# Patient Record
Sex: Female | Born: 1990 | Hispanic: Yes | Marital: Single | Smoking: Never smoker
Health system: Southern US, Community
[De-identification: ages and names within clinical notes are randomized; demographics above are authoritative.]

## PROBLEM LIST (undated history)

## (undated) DIAGNOSIS — K219 Gastro-esophageal reflux disease without esophagitis: Secondary | ICD-10-CM

---

## 2015-03-17 ENCOUNTER — Encounter: Payer: Self-pay | Admitting: Obstetrics and Gynecology

## 2015-03-22 ENCOUNTER — Encounter: Payer: Self-pay | Admitting: Advanced Practice Midwife

## 2015-04-06 ENCOUNTER — Encounter: Payer: Self-pay | Admitting: Obstetrics & Gynecology

## 2015-04-06 ENCOUNTER — Ambulatory Visit (INDEPENDENT_AMBULATORY_CARE_PROVIDER_SITE_OTHER): Payer: Self-pay | Admitting: Obstetrics & Gynecology

## 2015-04-06 VITALS — BP 127/80 | HR 88 | Ht 63.0 in | Wt 182.0 lb

## 2015-04-06 DIAGNOSIS — Z124 Encounter for screening for malignant neoplasm of cervix: Secondary | ICD-10-CM

## 2015-04-06 DIAGNOSIS — Z113 Encounter for screening for infections with a predominantly sexual mode of transmission: Secondary | ICD-10-CM

## 2015-04-06 DIAGNOSIS — Z3401 Encounter for supervision of normal first pregnancy, first trimester: Secondary | ICD-10-CM

## 2015-04-06 DIAGNOSIS — O0990 Supervision of high risk pregnancy, unspecified, unspecified trimester: Secondary | ICD-10-CM | POA: Insufficient documentation

## 2015-04-06 NOTE — Progress Notes (Signed)
Bedside ultrasound shows 7264w3d fetus with heartbeat.

## 2015-04-06 NOTE — Progress Notes (Signed)
      Subjective:    Susan Owens is a 24 y.o. G1P0 at 8365w3d, by LMP consistent with today's scan, being seen today for her first obstetrical visit.  Patient does intend to breast feed.  Patient reports no complaints.  Filed Vitals:   04/06/15 0950 04/06/15 0954  BP: 127/80   Pulse: 88   Height:  5\' 3"  (1.6 m)  Weight: 182 lb (82.555 kg)     HISTORY: OB History  Gravida Para Term Preterm AB SAB TAB Ectopic Multiple Living  1             # Outcome Date GA Lbr Len/2nd Weight Sex Delivery Anes PTL Lv  1 Current              History reviewed. No pertinent past medical history. History reviewed. No pertinent past surgical history. History reviewed. No pertinent family history.   Exam    Uterus:     Pelvic Exam:    Perineum: No Hemorrhoids, Normal Perineum   Vulva: normal   Vagina:  normal mucosa, normal discharge   Cervix: no bleeding following Pap, no cervical motion tenderness, no lesions and nulliparous appearance   Adnexa: normal adnexa and no mass, fullness, tenderness   Bony Pelvis: average  System: Breast:  normal appearance, no masses or tenderness   Skin: normal coloration and turgor, no rashes    Neurologic: oriented, normal   Extremities: normal strength, tone, and muscle mass, ROM of all joints is normal   HEENT PERRLA, extra ocular movement intact and sclera clear, anicteric   Mouth/Teeth mucous membranes moist, pharynx normal without lesions and dental hygiene good   Neck supple and no masses   Cardiovascular: regular rate and rhythm   Respiratory:  appears well, vitals normal, no respiratory distress, acyanotic, normal RR, chest clear, no wheezing, crepitations, rhonchi, normal symmetric air entry   Abdomen: soft, non-tender; bowel sounds normal; no masses,  no organomegaly   Urinary: urethral meatus normal      Assessment:    Pregnancy: G1P0 Patient Active Problem List   Diagnosis Date Noted  . Encounter for supervision of normal  first pregnancy 04/06/2015        Plan:     Initial labs drawn. Prenatal vitamins. Problem list reviewed and updated. Genetic Screening discussed First Screen: ordered.  Ultrasound discussed; fetal survey: ordered. [facility] with multiple MDs and other Advanced Practice Providers was explained to patient; also emphasized that residents, students are part of our team.  Follow up in 4 weeks.  Routine obstetric precautions reviewed.   Tereso NewcomerANYANWU,Pankaj Haack A, MD

## 2015-04-06 NOTE — Patient Instructions (Addendum)
Primer trimestre de Psychiatristembarazo (First Trimester of Pregnancy) El primer trimestre de Psychiatristembarazo se extiende desde la semana1 hasta el final de la semana12 (mes1 al mes3). Una semana despus de que un espermatozoide fecunda un vulo, este se implantar en la pared uterina. Este embrin comenzar a Camera operatordesarrollarse hasta convertirse en un beb. Sus genes y los de su pareja forman el beb. Los genes del varn determinan si ser un nio o una nia. Entre la semana6 y [place]8, se forman los ojos y Poole[place] rostro, y los latidos del corazn pueden verse en la ecografa. Al final de las 12semanas, todos los rganos del beb estn formados.  Ahora que est embarazada, querr hacer todo lo que est a su alcance para tener un beb sano. Dos de las cosas ms importantes son Winferd Humphreytener una buena atencin prenatal y seguir las indicaciones del mdico. La atencin prenatal incluye toda la asistencia mdica que usted recibe antes del nacimiento del beb. Esta ayudar a prevenir, Engineer, manufacturingdetectar y tratar cualquier problema durante el embarazo y [place] parto. CAMBIOS EN EL ORGANISMO Su organismo atraviesa por muchos cambios durante el [place]bar[place], y estos varan de Neomia Dearuna mujer a Educational psychologistotra.   Al principio, puede aumentar o bajar algunos kilos.  Puede tener Programme researcher, broadcasting/film/videomalestar estomacal (nuseas) y vomitar. Si no puede controlar los vmitos, llame al mdico.  Puede cansarse con facilidad.  Es posible que tenga dolores de cabeza que pueden aliviarse con los medicamentos que el mdico le permita tomar.  Puede orinar con mayor frecuencia. El dolor al orinar puede significar que usted tiene una infeccin de la vejiga.  Debido al Vanetta Muldersembarazo, puede tener acidez estomacal.  Puede estar estreida, ya que ciertas hormonas enlentecen los movimientos de los msculos que New York Life Insuranceempujan los desechos a travs de los intestinos.  Pueden aparecer hemorroides o abultarse e hincharse las venas (venas varicosas).  Las ConAgra Foodsmamas pueden empezar a Government social research officeragrandarse y Emergency planning/management officerestar sensibles. Los pezones  pueden sobresalir ms, y el tejido que los rodea (areola) tornarse ms oscuro.  Las Veterinary surgeonencas pueden sangrar y estar sensibles al cepillado y al hilo dental.  Pueden aparecer zonas oscuras o manchas (cloasma, mscara del Psychiatristembarazo) en el rostro que probablemente se atenuarn despus del nacimiento del beb.  Los perodos menstruales se interrumpirn.  Tal vez no tenga apetito.  Puede sentir un fuerte deseo de consumir ciertos alimentos.  Puede tener cambios a Theatre managernivel emocional da a da, por ejemplo, por momentos puede estar emocionada por el Psychiatristembarazo y por otros preocuparse porque algo pueda salir mal con el embarazo o el beb.  Tendr sueos ms vvidos y extraos.  Tal vez haya cambios en el cabello que pueden incluir su engrosamiento, crecimiento rpido y cambios en la textura. A algunas mujeres tambin se les cae el cabello durante o despus del Wells Riverembarazo, o tienen el cabello seco o fino. Lo ms probable es que el cabello se le normalice despus del nacimiento del beb. QU DEBE ESPERAR EN LAS CONSULTAS PRENATALES Durante una visita prenatal de rutina:  La pesarn para asegurarse de que usted y el beb estn creciendo normalmente.  Le controlarn la presin arterial.  Le medirn el abdomen para controlar el desarrollo del beb.  Se escucharn los latidos cardacos a partir de la semana10 o la12 de embarazo, aproximadamente.  Se analizarn los resultados de los estudios solicitados en visitas anteriores. El mdico puede preguntarle:  Cmo se siente.  Si siente los movimientos del beb.  Si ha tenido FedExsntomas anormales, como prdida de lquido, Urichsangrado, dolores de cabeza intensos  o clicos abdominales.  Si tiene Colgate-Palmolive. Otros estudios que pueden realizarse durante el primer trimestre incluyen lo siguiente:  Anlisis de sangre para determinar el tipo de sangre y Engineer, manufacturing la presencia de infecciones previas. Adems, se los usar para controlar si los niveles de hierro  son bajos (anemia) y Chief Strategy Officer los anticuerpos Rh. En una etapa ms avanzada del [place], se harn anlisis de sangre para saber si tiene diabetes, junto con otros estudios si surgen problemas.  Anlisis de orina para detectar infecciones, diabetes o protenas en la orina.  Una ecografa para confirmar que el beb crece y se desarrolla correctamente.  Una amniocentesis para diagnosticar posibles problemas genticos.  Estudios del feto para descartar espina bfida y sndrome de Down.  Es posible que necesite otras pruebas adicionales. INSTRUCCIONES PARA EL CUIDADO EN EL HOGAR  Medicamentos:  Siga las indicaciones del mdico en relacin con el uso de medicamentos. Durante el embarazo, hay medicamentos que pueden tomarse y otros que no.  Tome las vitaminas prenatales como se le indic.  Si est estreida, tome un laxante suave, si el mdico lo [place]. Dieta  Consuma alimentos balanceados. Elija alimentos variados, como carne o protenas de origen vegetal, pescado, leche y productos lcteos descremados, verduras, frutas y panes y Radiation protection practitioner. El mdico la ayudar a Production assistant, radio cantidad de peso que puede [place].  No coma carne cruda ni quesos sin cocinar. Estos elementos contienen bacterias que pueden causar defectos congnitos en el beb.  La ingesta diaria de cuatro o cinco comidas pequeas en lugar de tres comidas abundantes puede ayudar a Yahoo nuseas y los vmitos. Si empieza a tener nuseas, comer algunas [address] puede ser de [place]. Beber lquidos [place] comidas en lugar de tomarlos durante las comidas tambin puede ayudar a Optician, dispensing las nuseas y los vmitos.  Si est estreida, consuma alimentos con alto contenido de [place], como verduras y frutas frescas, y Radiation protection practitioner. Beba suficiente lquido para Photographer orina clara o de color amarillo plido. Actividad y Landscape architect ejercicio solamente como se lo haya indicado el mdico. El  ejercicio la ayudar a:  Art gallery manager.  Mantenerse en forma.  Estar preparada para el trabajo de parto y [place].  Los dolores, los clicos en la parte baja del abdomen o los calambres en la cintura son un buen indicio de que debe dejar de Corporate treasurer. Consulte al mdico antes de seguir haciendo ejercicios normales.  Intente no estar de pie FedEx. Mueva las piernas con frecuencia si debe estar de pie en un lugar durante mucho tiempo.  Evite levantar pesos [employer] Brands.  Use zapatos de tacones bajos y [place].  Puede seguir teniendo [employer] [place], excepto que el mdico le indique lo contrario. Alivio del dolor o las molestias  Use un sostn que le brinde buen soporte si siente dolor a la palpacin Mattel.  Dese baos de asiento con agua tibia para Engineer, materials o las molestias causadas por las hemorroides. Use crema antihemorroidal si el mdico se lo permite.  Descanse con las piernas elevadas si tiene calambres o dolor de cintura.  Si tiene venas varicosas en las piernas, use medias de descanso. Eleve los pies durante , 3 o 4veces por da. Limite la cantidad de sal en su dieta. Cuidados prenatales  Programe las visitas prenatales para la semana12 de [place]. Generalmente se programan cada mes al principio y se hacen ms frecuentes en los 2 ltimos meses  antes del parto.  Escriba sus preguntas. Llvelas cuando concurra a las visitas prenatales.  Concurra a todas las visitas prenatales como se lo haya indicado el mdico. Seguridad  Colquese el cinturn de seguridad cuando conduzca.  Haga una lista de los nmeros de telfono de Associate Professor, que [employer] nmeros de telfono de familiares, [place], el hospital y los departamentos de polica y bomberos. Consejos generales  Pdale al mdico que la derive a clases de educacin prenatal en su localidad. Debe comenzar a tomar las clases antes de Cytogeneticist en el mes6  de embarazo.  Pida ayuda si tiene necesidades nutricionales o de asesoramiento Academic librarian. El mdico puede aconsejarla o derivarla a especialistas para que la ayuden con diferentes necesidades.  No se d baos de inmersin en agua caliente, baos turcos ni saunas.  No se haga duchas vaginales ni use tampones o toallas higinicas perfumadas.  No mantenga las piernas cruzadas durante [place].  Evite el contacto con las bandejas sanitarias de los gatos y la tierra que estos animales usan. Estos elementos contienen bacterias que pueden causar defectos congnitos al beb y la posible prdida del feto debido a un aborto espontneo o muerte fetal.  No fume, no consuma hierbas ni medicamentos que no hayan sido recetados por el mdico. Las sustancias qumicas que estos productos contienen afectan la formacin y el desarrollo del beb.  Programe una cita con el dentista. En su casa, lvese los dientes con un cepillo dental blando y psese el hilo dental con suavidad. SOLICITE ATENCIN MDICA SI:   Tiene mareos.  Siente clicos leves, presin en la pelvis o dolor persistente en el abdomen.  Tiene nuseas, vmitos o diarrea persistentes.  Tiene secrecin vaginal con mal olor.  Siente dolor al ConocoPhillips.  Tiene el rostro, las [place], las piernas o los tobillos ms hinchados. SOLICITE ATENCIN MDICA DE INMEDIATO SI:   Tiene fiebre.  Tiene una prdida de lquido por la vagina.  Tiene sangrado o pequeas prdidas vaginales.  Siente dolor intenso o clicos en el abdomen.  Sube o baja de peso rpidamente.  Vomita sangre de color rojo brillante o material que parezca granos de caf.  Ha estado expuesta a la rubola y no ha sufrido la enfermedad.  Ha estado expuesta a la quinta enfermedad o a la varicela.  Tiene un dolor de cabeza intenso.  Le falta el aire.  Sufre cualquier tipo de traumatismo, por ejemplo, debido a una cada o un accidente automovilstico. Document  Released: 06/26/2005 Document Revised: 01/31/2014 [facility] Patient Information 2015 [place], [place]. This information is not intended to replace advice given to you by your health care provider. Make sure you discuss any questions you have with your health care provider.  Segundo trimestre de Psychiatrist (Second Trimester of Pregnancy) El segundo trimestre va desde la semana13 hasta la 28, desde el cuarto hasta el sexto mes, y suele ser el momento en el que mejor se siente. Su organismo se ha adaptado a Charity fundraiser y comienza a Diplomatic Services operational officer. En general, las nuseas matutinas han disminuido o han desaparecido completamente, p El segundo trimestre es tambin la poca en la que el feto se desarrolla rpidamente. Hacia el final del sexto mes, el feto mide aproximadamente 9pulgadas (23cm) y pesa alrededor de 1 libras (700g). Es probable que sienta que el beb se Teacher, English as a foreign language (da pataditas) entre las 18 y 20semanas del Psychiatrist. CAMBIOS EN EL ORGANISMO Su organismo atraviesa por muchos cambios durante el [place], y estos varan de [place]  a otra.   Seguir American Standard Companies. Notar que la parte baja del abdomen sobresale.  Podrn aparecer las primeras Albertson's caderas, el abdomen y las [place].  Es posible que tenga dolores de cabeza que pueden aliviarse con los medicamentos que su mdico autorice.  Tal vez tenga necesidad de orinar con ms frecuencia porque el feto est ejerciendo presin Ambulance person.  Debido al Vanetta Mulders podr sentir Anthoney Harada estomacal con frecuencia.  Puede estar estreida, ya que ciertas hormonas enlentecen los movimientos de los msculos que New York Life Insurance desechos a travs de los intestinos.  Pueden aparecer hemorroides o abultarse e hincharse las venas (venas varicosas).  Puede tener dolor de espalda que se debe al Citigroup de peso y a que las hormonas del Management consultant las articulaciones entre los huesos de la pelvis, y Public librarian consecuencia de la  modificacin del peso y los msculos que mantienen el equilibrio.  Las ConAgra Foods seguirn creciendo y Development worker, community.  Las Veterinary surgeon y estar sensibles al cepillado y al hilo dental.  Pueden aparecer zonas oscuras o manchas (cloasma, mscara del Psychiatrist) en el rostro que probablemente se atenuarn despus del nacimiento del beb.  Es posible que se forme una lnea oscura desde el ombligo hasta la zona del pubis (linea nigra) que probablemente se atenuarn despus del nacimiento del beb.  Tal vez haya cambios en el cabello que pueden incluir su engrosamiento, crecimiento rpido y cambios en la textura. Adems, a algunas mujeres se les cae el cabello durante o despus del embarazo, o tienen el cabello seco o fino. Lo ms probable es que el cabello se le normalice despus del nacimiento del beb. QU DEBE ESPERAR EN LAS CONSULTAS PRENATALES Durante una visita prenatal de rutina:  La pesarn para asegurarse de que usted y el feto estn creciendo normalmente.  Le tomarn la presin arterial.  Le medirn el abdomen para controlar el desarrollo del beb.  Se escucharn los latidos cardacos fetales.  Se evaluarn los resultados de los estudios solicitados en visitas anteriores. El mdico puede preguntarle lo siguiente:  Cmo se siente.  Si siente los movimientos del beb.  Si ha tenido sntomas anormales, como prdida de lquido, [place], dolores de cabeza intensos o clicos abdominales.  Si tiene Colgate-Palmolive. Otros estudios que podrn realizarse durante el segundo trimestre incluyen lo siguiente:  Anlisis de sangre para detectar:  Concentraciones de hierro bajas (anemia).  Diabetes gestacional (entre la semana 24 y la 28).  Anticuerpos Rh.  Anlisis de orina para detectar infecciones, diabetes o protenas en la orina.  Una ecografa para confirmar que el beb crece y se desarrolla correctamente.  Una amniocentesis para diagnosticar posibles problemas  genticos.  Estudios del feto para descartar espina bfida y sndrome de [place]. INSTRUCCIONES PARA EL CUIDADO EN EL HOGAR   Evite fumar, consumir hierbas, beber alcohol y tomar frmacos que no le hayan recetado. Estas sustancias qumicas afectan la formacin y el desarrollo del beb.  Siga las indicaciones del mdico en relacin con el uso de medicamentos. Durante el embarazo, hay medicamentos que son seguros de tomar y otros que no.  Haga actividad fsica solo en la forma indicada por el mdico. Sentir clicos uterinos es un buen signo para Restaurant manager, fast food actividad fsica.  Contine comiendo alimentos que sanos con regularidad.  Use un sostn que le brinde buen soporte si le Altria [employer].  No se d baos de inmersin en agua caliente, baos turcos ni saunas.  Colquese el cinturn de seguridad  cuando conduzca.  No coma carne cruda ni queso sin cocinar; evite el contacto con las bandejas sanitarias de los gatos y la tierra que estos animales usan. Estos elementos contienen grmenes que pueden causar defectos congnitos en el beb.  Tome las vitaminas prenatales.  Si est estreida, pruebe un laxante suave (si el mdico lo autoriza). Consuma ms alimentos ricos en fibra, como vegetales y frutas frescos y Radiation protection practitioner. Beba gran cantidad de lquido para mantener la orina de tono claro o color amarillo plido.  Dese baos de asiento con agua tibia para Engineer, materials o las molestias causadas por las hemorroides. Use una crema para las hemorroides si el mdico la autoriza.  Si tiene venas varicosas, use medias de descanso. Eleve los pies durante , 3 o 4veces por da. Limite la cantidad de sal en su dieta.  No levante objetos pesados, use zapatos de tacones bajos y [address].  Descanse con las piernas elevadas si tiene calambres o dolor de cintura.  Visite a su dentista si an no lo ha Occupational hygienist. Use un cepillo de dientes blando para  higienizarse los dientes y psese el hilo dental con suavidad.  Puede seguir [employer], a menos que el mdico le indique lo contrario.  Concurra a todas las visitas prenatales segn las indicaciones de su mdico. SOLICITE ATENCIN MDICA SI:   [place].  Siente clicos leves, presin en la pelvis o dolor persistente en el abdomen.  Tiene nuseas, vmitos o diarrea persistentes.  Tiene secrecin vaginal con mal olor.  Siente dolor al ConocoPhillips. SOLICITE ATENCIN MDICA DE INMEDIATO SI:   Tiene fiebre.  Tiene una prdida de lquido por la vagina.  Tiene sangrado o pequeas prdidas vaginales.  Siente dolor intenso o clicos en el abdomen.  Sube o baja de peso rpidamente.  Tiene dificultad para respirar y siente dolor de pecho.  Sbitamente se le hinchan mucho el rostro, las [place], los tobillos, los pies o las piernas.  No ha sentido los movimientos del beb durante Georgianne Fick.  Siente un dolor de cabeza intenso que no se alivia con medicamentos.  Hay cambios en la visin. Document Released: 06/26/2005 Document Revised: 09/21/2013 [facility] Patient Information 2015 [place], [place]. This information is not intended to replace advice given to you by your health care provider. Make sure you discuss any questions you have with your health care provider.  Lactancia materna (Breastfeeding) Decidir Museum/gallery exhibitions officer es una de las mejores elecciones que puede hacer por usted y su beb. El cambio hormonal durante el Psychiatrist produce el desarrollo del tejido mamario y [place] la cantidad y el tamao de los conductos galactforos. Estas hormonas tambin permiten que las protenas, los azcares y las grasas de la sangre produzcan la [employer] materna en las glndulas productoras de [place]. Las hormonas impiden que la leche materna sea liberada antes del nacimiento del beb, adems de impulsar el flujo de leche luego del nacimiento. Una vez que ha comenzado a Museum/gallery exhibitions officer, Conservation officer, nature  beb, as Immunologist succin o Theatre manager, pueden estimular la liberacin de [place] de las glndulas productoras de [place].  LOS BENEFICIOS DE AMAMANTAR Para el beb  La primera leche (calostro) ayuda a Careers information officer funcionamiento del sistema digestivo del beb.  La leche tiene anticuerpos que ayudan a Radio producer las infecciones en el beb.  El beb tiene una menor incidencia de asma, alergias y del sndrome de muerte sbita del lactante.  Los nutrientes en la Alexis Goodell materna son  mejores para el beb que la [place] y estn preparados exclusivamente para cubrir las necesidades del beb.  La leche materna mejora el desarrollo cerebral del beb.  Es menos probable que el beb desarrolle otras enfermedades, como obesidad infantil, asma o diabetes mellitus de tipo 2. Para usted   La lactancia materna favorece el desarrollo de un vnculo muy especial entre la madre y el beb.  Es conveniente. La leche materna siempre est disponible a la Human resources officer y es [place].  La lactancia materna ayuda a quemar caloras y a perder el peso ganado durante el [place].  Favorece la contraccin del tero al tamao que tena antes del embarazo de manera ms rpida y disminuye el sangrado (loquios) despus del parto.  La lactancia materna contribuye a reducir Nurse, adult de desarrollar diabetes mellitus de tipo 2, osteoporosis o cncer de mama o de ovario en el futuro. SIGNOS DE QUE EL BEB EST HAMBRIENTO Primeros signos de [address] de [place].  Se estira.  Mueve la cabeza de un lado a otro.  Mueve la cabeza y abre la boca cuando se le toca la mejilla o la comisura de la boca (reflejo de bsqueda).  Aumenta las vocalizaciones, tales como sonidos de succin, se relame los labios, emite arrullos, suspiros, o chirridos.  Mueve la [employer] boca.  Se chupa con ganas los dedos o las manos. Signos tardos de Fisher Scientific.  Llora de manera  intermitente. Signos de [employer] Corporation signos de hambre extrema requerirn que lo calme y lo consuele antes de que el beb pueda alimentarse adecuadamente. No espere a que se manifiesten los siguientes signos de hambre extrema para comenzar a Museum/gallery exhibitions officer:   Designer, jewellery.  Llanto intenso y fuerte.   Gritos. INFORMACIN BSICA SOBRE LA LACTANCIA MATERNA Iniciacin de la lactancia materna  Encuentre un lugar cmodo para sentarse o acostarse, con un buen respaldo para el cuello y la espalda.  Coloque una almohada o una manta enrollada debajo del beb para acomodarlo a la altura de la mama (si est sentada). Las almohadas para Museum/gallery exhibitions officer se han diseado especialmente a fin de servir de apoyo para los brazos y el beb [employer].  Asegrese de que el abdomen del beb est frente al suyo.  Masajee suavemente la mama. Con las yemas de los dedos, masajee la pared del pecho hacia el pezn en un movimiento circular. Esto estimula el flujo de [place]. Es posible que Engineer, manufacturing systems este movimiento mientras amamanta si la leche fluye lentamente.  Sostenga la mama con el pulgar por arriba del pezn y los otros 4 dedos por debajo de la mama. Asegrese de que los dedos se encuentren lejos del pezn y de la boca del beb.  Empuje suavemente los labios del beb con el pezn o con el dedo.  Cuando la boca del beb se abra lo suficiente, acrquelo rpidamente a la mama e introduzca todo el pezn y la zona oscura que lo rodea (areola), tanto como sea posible, dentro de la boca del beb.  Debe haber ms areola visible por arriba del labio superior del beb que por debajo del labio inferior.  La lengua del beb debe estar entre la enca inferior y la [place].  Asegrese de que la boca del beb est en la posicin correcta alrededor del pezn (prendida). Los labios del beb deben crear un sello sobre la mama y estar doblados hacia afuera (invertidos).  Es comn que el beb succione durante  2 a 3 minutos para  que comience el flujo de Colgate Palmolive. Cmo debe prenderse Es muy importante que le ensee al beb cmo prenderse adecuadamente a la mama. Si el beb no se prende adecuadamente, puede causarle dolor en el pezn y reducir la produccin de [place], y hacer que el beb tenga un escaso aumento de [place]. Adems, si el beb no se prende adecuadamente al pezn, puede tragar aire durante la alimentacin. Esto puede causarle molestias al beb. Hacer eructar al beb al [place] de mama puede ayudarlo a liberar el aire. Sin embargo, ensearle al beb cmo prenderse a la mama adecuadamente es la mejor manera de evitar que se sienta molesto por tragar Oceanographer se alimenta. Signos de que el beb se ha prendido adecuadamente al pezn:   Payton Doughty o succiona de modo silencioso, sin causarle dolor.  Se escucha que traga cada 3 o 4 succiones.   Hay movimientos musculares por arriba y por delante de sus odos al Printmaker. Signos de que el beb no se ha prendido Audiological scientist al pezn:   Hace ruidos de succin o de chasquido mientras se alimenta.  Siente dolor en el pezn. Si cree que el beb no se prendi correctamente, deslice el dedo en la comisura de la boca y [employer] Corporation las encas del beb para interrumpir la succin. Intente comenzar a amamantar nuevamente. Signos de Fish farm manager Signos del beb:   Disminuye gradualmente el nmero de succiones o cesa la succin por completo.  Se duerme.  Relaja el cuerpo.  Retiene una pequea cantidad de [facility] boca.  Se desprende solo del pecho. Signos que presenta usted:  Las mamas han aumentado la firmeza, el peso y el tamao 1 a 3 horas despus de Museum/gallery exhibitions officer.  Estn ms blandas inmediatamente despus de amamantar.  Un aumento del volumen de [place], y tambin un cambio en su consistencia y color se producen hacia el quinto da de Tour manager.  Los pezones no duelen, ni estn agrietados ni sangran. Signos de que su beb  recibe la cantidad de leche suficiente  Moja al menos 3 paales en 24 horas. La orina debe ser clara y de color amarillo plido a los [address]  [address] de [place].  Defeca al menos 3 veces en 24 horas a los 5 [address]  [address] de [address] La materia fecal debe ser blanda y [place].  Defeca al menos 3 veces en 24 horas a los [address] de [address] La materia fecal debe ser grumosa y [place].  No registra una prdida de peso mayor del 10% del peso al nacer durante los primeros 3 [address]  [address] de [place].  Aumenta de peso un promedio de 4 a 7onzas (113 a 198g) por semana despus de los 4 [address]  [address] de vida.  Aumenta de [place], [place], de Arab[place] uniforme a Glass blower/designer de los 5 [address]  [address] de vida, sin Passenger transport manager prdida de peso despus de las 2semanas de vida. Despus de alimentarse, es posible que el beb regurgite una pequea cantidad. Esto es frecuente. FRECUENCIA Y DURACIN DE LA LACTANCIA MATERNA El amamantamiento frecuente la ayudar a producir ms [place] y a Education officer, community de Engineer, mining en los pezones e hinchazn en las [place]. Alimente al beb cuando muestre signos de hambre o si siente la necesidad de reducir la congestin de las [place]. Esto se denomina "lactancia a demanda". Evite el uso del chupete mientras trabaja para establecer la lactancia (las primeras 4 a 6 semanas despus del nacimiento del beb). Despus de este perodo, podr ofrecerle un chupete.  Las investigaciones demostraron que el uso del chupete durante el primer ao de vida del beb disminuye el riesgo de desarrollar el sndrome de muerte sbita del lactante (SMSL). Permita que el nio se alimente en cada mama todo lo que desee. Contine amamantando al beb hasta que haya terminado de alimentarse. Cuando el beb se desprende o se queda dormido mientras se est alimentando de la primera mama, ofrzcale la segunda. Debido a que, con frecuencia, los recin [place] las primeras semanas de vida, es posible que deba despertar al beb para alimentarlo. Los horarios  de Acupuncturist de un beb a otro. Sin embargo, las siguientes reglas pueden servir como gua para ayudarla a Lawyer que el beb se alimenta adecuadamente:  Se puede amamantar a los recin nacidos (bebs de 4 semanas o menos de vida) cada 1 a 3 horas.  No deben transcurrir ms de 3 horas durante el da o 5 horas durante la noche sin que se amamante a los recin nacidos.  Debe amamantar al beb 8 veces como mnimo en un perodo de 24 horas, hasta que comience a introducir slidos en su dieta, a los 6 meses de vida aproximadamente. EXTRACCIN DE [employer] MATERNA La extraccin y Contractor de la leche materna le permiten asegurarse de que el beb se alimente exclusivamente de [place], aun en momentos en los que no puede amamantar. Esto tiene especial importancia si debe regresar al Aleen Campi en el perodo en que an est amamantando o si no puede estar presente en los momentos en que el beb debe alimentarse. Su asesor en lactancia puede orientarla sobre cunto tiempo es seguro almacenar [place].  El sacaleche es un aparato que le permite extraer leche de la mama a un recipiente estril. Luego, la leche materna extrada puede almacenarse en un refrigerador o Electrical engineer. Algunos sacaleches son Birdie Riddle, Delaney Meigs otros son elctricos. Consulte a su asesor en lactancia qu tipo ser ms conveniente para usted. Los sacaleches se pueden comprar; sin embargo, algunos hospitales y grupos de apoyo a la lactancia materna alquilan Sports coach. Un asesor en lactancia puede ensearle cmo extraer [employer], en caso de que prefiera no usar un sacaleche.  CMO CUIDAR LAS MAMAS DURANTE LA LACTANCIA MATERNA Los pezones se secan, agrietan y duelen durante la Tour manager. Las siguientes recomendaciones pueden ayudarla a Pharmacologist las [employer] y sanas:  Careers information officer usar jabn en los pezones.  Use un sostn de soporte. Aunque no son esenciales, las camisetas sin  mangas o los sostenes especiales para Museum/gallery exhibitions officer estn diseados para acceder fcilmente a las mamas, para Museum/gallery exhibitions officer sin tener que quitarse todo el sostn o la camiseta. Evite usar sostenes con aro o sostenes muy ajustados.  Seque al aire sus pezones durante 3 a despus de amamantar al beb.  Utilice solo apsitos de Haematologist sostn para Environmental health practitioner las prdidas de [place]. La prdida de un poco de [employer] tomas es normal.  Utilice lanolina sobre los pezones luego de Museum/gallery exhibitions officer. La lanolina ayuda a mantener la humedad normal de la piel. Si [place] lanolina pura, no tiene que lavarse los pezones antes de volver a Corporate treasurer al beb. La lanolina pura no es txica para el beb. Adems, puede extraer [employer] algunas gotas de [place] materna y Engineer, maintenance (IT) suavemente esa [employer], para que la [place] se seque al aire. Durante las primeras semanas despus de dar a luz, algunas mujeres pueden experimentar hinchazn en las mamas (congestin [place]). La congestin  puede hacer que sienta las mamas pesadas, calientes y sensibles al tacto. El pico de la congestin ocurre dentro de los 3 a 5 das despus del [place]. Las siguientes recomendaciones pueden ayudarla a Paramedic la congestin:  Vace por completo las mamas al [employer] o Environmental health practitioner. Puede aplicar calor hmedo en las mamas (en la ducha o con toallas hmedas para manos) antes de Museum/gallery exhibitions officer o extraer [employer]. Esto aumenta la circulacin y [place] a que la [place]. Si el beb no vaca por completo las [address] cuando lo [address], extraiga la [place] restante despus de que haya finalizado.  Use un sostn ajustado (para amamantar o comn) o una camiseta sin mangas durante 1 o 2 das para indicar al cuerpo que disminuya ligeramente la produccin de [place].  Aplique compresas de hielo [employer], a menos que le resulte demasiado incmodo.  Asegrese de que el beb est prendido y se encuentre en la posicin correcta mientras lo  alimenta. Si la congestin persiste luego de 48 horas o despus de seguir estas recomendaciones, comunquese con su mdico o un Holiday representative. RECOMENDACIONES GENERALES PARA EL CUIDADO DE LA SALUD DURANTE LA LACTANCIA MATERNA  Consuma alimentos saludables. Alterne comidas y colaciones, y coma 3 de cada una por da. Dado que lo que come [employer], es posible que algunas comidas hagan que su beb se vuelva ms irritable de lo habitual. Evite comer este tipo de alimentos si percibe que afectan de manera negativa al beb.  Beba leche, jugos de fruta y agua para Patent examiner su sed (aproximadamente 10 vasos al Futures trader).  Descanse con frecuencia, reljese y tome sus vitaminas prenatales para evitar la fatiga, el estrs y la anemia.  Contine con los autocontroles de la mama.  Evite masticar y fumar tabaco.  Evite el consumo de alcohol y drogas. Algunos medicamentos, que pueden ser perjudiciales para el beb, pueden pasar a travs de la [employer]. Es importante que consulte a su mdico antes de Medical sales representative, incluidos todos los medicamentos recetados y de [place], as como los suplementos vitamnicos y herbales. Puede quedar embarazada durante la lactancia. Si desea controlar la natalidad, consulte a su mdico cules son las opciones ms seguras para el beb. SOLICITE ATENCIN MDICA SI:   Usted siente que quiere dejar de Museum/gallery exhibitions officer o se siente frustrada con la lactancia.  Siente dolor en las mamas o en los pezones.  Sus pezones estn agrietados o Water quality scientist.  Sus pechos estn irritados, sensibles o calientes.  Tiene un rea hinchada en cualquiera de las mamas.  Siente escalofros o fiebre.  Tiene nuseas o vmitos.  Presenta una secrecin de otro lquido distinto de la leche materna de los pezones.  Sus mamas no se llenan antes de Museum/gallery exhibitions officer al beb para el quinto da despus del [place].  Se siente triste y deprimida.  El beb est demasiado somnoliento  como para comer bien.  El beb tiene problemas para dormir.  Moja menos de 3 paales en 24 horas.  Defeca menos de 3 veces en 24 horas.  La piel del beb o la parte blanca de los ojos se vuelven amarillentas.  El beb no ha aumentado de [place] a los [address] de [place]. SOLICITE ATENCIN MDICA DE INMEDIATO SI:   El beb est muy cansado Retail buyer) y no se quiere despertar para comer.  Le sube la fiebre sin causa. Document Released: 09/16/2005 Document Revised: 09/21/2013 [facility] Patient Information 2015 [place], [place]. This information is not intended to replace  advice given to you by your health care provider. Make sure you discuss any questions you have with your health care provider.  

## 2015-04-07 LAB — CYTOLOGY - PAP

## 2015-04-07 LAB — PRENATAL PROFILE (SOLSTAS)
ANTIBODY SCREEN: NEGATIVE
BASOS PCT: 0 % (ref 0–1)
Basophils Absolute: 0 10*3/uL (ref 0.0–0.1)
EOS ABS: 0 10*3/uL (ref 0.0–0.7)
Eosinophils Relative: 0 % (ref 0–5)
HCT: 38 % (ref 36.0–46.0)
HIV: NONREACTIVE
Hemoglobin: 12.7 g/dL (ref 12.0–15.0)
Hepatitis B Surface Ag: NEGATIVE
LYMPHS ABS: 1.5 10*3/uL (ref 0.7–4.0)
Lymphocytes Relative: 19 % (ref 12–46)
MCH: 30.2 pg (ref 26.0–34.0)
MCHC: 33.4 g/dL (ref 30.0–36.0)
MCV: 90.5 fL (ref 78.0–100.0)
MPV: 8.7 fL (ref 8.6–12.4)
Monocytes Absolute: 0.5 10*3/uL (ref 0.1–1.0)
Monocytes Relative: 6 % (ref 3–12)
NEUTROS ABS: 6.1 10*3/uL (ref 1.7–7.7)
NEUTROS PCT: 75 % (ref 43–77)
Platelets: 406 10*3/uL — ABNORMAL HIGH (ref 150–400)
RBC: 4.2 MIL/uL (ref 3.87–5.11)
RDW: 13.1 % (ref 11.5–15.5)
RH TYPE: POSITIVE
RUBELLA: 5.87 {index} — AB (ref <0.90)
WBC: 8.1 10*3/uL (ref 4.0–10.5)

## 2015-04-08 LAB — CULTURE, OB URINE
COLONY COUNT: NO GROWTH
Organism ID, Bacteria: NO GROWTH

## 2015-05-04 ENCOUNTER — Encounter: Payer: Self-pay | Admitting: Obstetrics & Gynecology

## 2015-05-04 ENCOUNTER — Ambulatory Visit (INDEPENDENT_AMBULATORY_CARE_PROVIDER_SITE_OTHER): Payer: Self-pay | Admitting: Obstetrics & Gynecology

## 2015-05-04 VITALS — BP 113/69 | HR 87 | Wt 185.0 lb

## 2015-05-04 DIAGNOSIS — E669 Obesity, unspecified: Secondary | ICD-10-CM

## 2015-05-04 DIAGNOSIS — O99210 Obesity complicating pregnancy, unspecified trimester: Secondary | ICD-10-CM | POA: Insufficient documentation

## 2015-05-04 DIAGNOSIS — Z36 Encounter for antenatal screening of mother: Secondary | ICD-10-CM

## 2015-05-04 DIAGNOSIS — O99212 Obesity complicating pregnancy, second trimester: Secondary | ICD-10-CM

## 2015-05-04 DIAGNOSIS — Z3402 Encounter for supervision of normal first pregnancy, second trimester: Secondary | ICD-10-CM

## 2015-05-04 NOTE — Progress Notes (Signed)
Subjective:  Susan Owens is a 24 y.o. G1P0 at [redacted]w[redacted]d being seen today for ongoing prenatal care.  Patient reports no complaints.   .   .  . Denies leaking of fluid.   The following portions of the patient's history were reviewed and updated as appropriate: allergies, current medications, past family history, past medical history, past social history, past surgical history and problem list.   Objective:   Filed Vitals:   05/04/15 0953  BP: 113/69  Pulse: 87  Weight: 185 lb (83.915 kg)    Fetal Status:           General:  Alert, oriented and cooperative. Patient is in no acute distress.  Skin: Skin is warm and dry. No rash noted.   Cardiovascular: Normal heart rate noted  Respiratory: Normal respiratory effort, no problems with respiration noted  Abdomen: Soft, gravid, appropriate for gestational age.       Vaginal:  .       Cervix: Not evaluated        Extremities: Normal range of motion.     Mental Status: Normal mood and affect. Normal behavior. Normal judgment and thought content.   Urinalysis:      Assessment and Plan:  Pregnancy: G1P0 at [redacted]w[redacted]d  1. Encounter for supervision of normal first pregnancy in second trimester  - US OB Detail + 14 Wk; Future - She declines all genetic testing. That is why she did not keep the First Screen appt with MFM.  2. Obesity affecting pregnancy in second trimester  - Glucose Tolerance, 1 HR (50g)  Preterm labor symptoms and general obstetric precautions including but not limited to vaginal bleeding, contractions, leaking of fluid and fetal movement were reviewed in detail with the patient. Please refer to After Visit Summary for other counseling recommendations.  Return in about 4 weeks (around 06/01/2015).   Allie Bossier, MD

## 2015-05-06 LAB — GLUCOSE TOLERANCE, 1 HOUR (50G) W/O FASTING: GLUCOSE 1 HOUR GTT: 77 mg/dL (ref 70–140)

## 2015-05-16 ENCOUNTER — Ambulatory Visit (HOSPITAL_COMMUNITY)
Admission: RE | Admit: 2015-05-16 | Payer: Self-pay | Source: Ambulatory Visit | Attending: Obstetrics & Gynecology | Admitting: Obstetrics & Gynecology

## 2015-05-22 ENCOUNTER — Other Ambulatory Visit: Payer: Self-pay | Admitting: Obstetrics & Gynecology

## 2015-05-22 ENCOUNTER — Ambulatory Visit (HOSPITAL_COMMUNITY)
Admission: RE | Admit: 2015-05-22 | Discharge: 2015-05-22 | Disposition: A | Discharge status: Home / Self Care | Payer: Self-pay | Source: Ambulatory Visit | Attending: Obstetrics & Gynecology | Admitting: Obstetrics & Gynecology

## 2015-05-22 DIAGNOSIS — Z3A19 19 weeks gestation of pregnancy: Secondary | ICD-10-CM | POA: Insufficient documentation

## 2015-05-22 DIAGNOSIS — O99212 Obesity complicating pregnancy, second trimester: Secondary | ICD-10-CM | POA: Insufficient documentation

## 2015-05-22 DIAGNOSIS — Z3689 Encounter for other specified antenatal screening: Secondary | ICD-10-CM

## 2015-05-22 DIAGNOSIS — Z36 Encounter for antenatal screening of mother: Secondary | ICD-10-CM | POA: Insufficient documentation

## 2015-05-22 DIAGNOSIS — O4402 Placenta previa specified as without hemorrhage, second trimester: Secondary | ICD-10-CM

## 2015-05-22 DIAGNOSIS — E669 Obesity, unspecified: Secondary | ICD-10-CM | POA: Insufficient documentation

## 2015-05-22 DIAGNOSIS — Z3401 Encounter for supervision of normal first pregnancy, first trimester: Secondary | ICD-10-CM

## 2015-05-29 ENCOUNTER — Other Ambulatory Visit: Payer: Self-pay | Admitting: Obstetrics & Gynecology

## 2015-05-29 DIAGNOSIS — O26872 Cervical shortening, second trimester: Secondary | ICD-10-CM

## 2015-05-29 DIAGNOSIS — Z3A23 23 weeks gestation of pregnancy: Secondary | ICD-10-CM

## 2015-05-29 DIAGNOSIS — O99212 Obesity complicating pregnancy, second trimester: Secondary | ICD-10-CM

## 2015-05-29 DIAGNOSIS — Z3689 Encounter for other specified antenatal screening: Secondary | ICD-10-CM

## 2015-06-04 ENCOUNTER — Other Ambulatory Visit: Payer: Self-pay | Admitting: Obstetrics & Gynecology

## 2015-06-04 DIAGNOSIS — Z3689 Encounter for other specified antenatal screening: Secondary | ICD-10-CM

## 2015-06-04 DIAGNOSIS — Z3A19 19 weeks gestation of pregnancy: Secondary | ICD-10-CM

## 2015-06-04 DIAGNOSIS — O99212 Obesity complicating pregnancy, second trimester: Secondary | ICD-10-CM

## 2015-06-05 DIAGNOSIS — O4400 Placenta previa specified as without hemorrhage, unspecified trimester: Secondary | ICD-10-CM | POA: Insufficient documentation

## 2015-06-06 ENCOUNTER — Ambulatory Visit (INDEPENDENT_AMBULATORY_CARE_PROVIDER_SITE_OTHER): Payer: Self-pay | Admitting: Obstetrics & Gynecology

## 2015-06-06 VITALS — BP 124/75 | HR 88 | Wt 186.0 lb

## 2015-06-06 DIAGNOSIS — Z3402 Encounter for supervision of normal first pregnancy, second trimester: Secondary | ICD-10-CM

## 2015-06-06 NOTE — Progress Notes (Signed)
Subjective:  Susan Owens is a 24 y.o. G1P0 at [redacted]w[redacted]d being seen today for ongoing prenatal care.  Patient reports no complaints.  Contractions: Not present.  Vag. Bleeding: None. Movement: Present. Denies leaking of fluid.   The following portions of the patient's history were reviewed and updated as appropriate: allergies, current medications, past family history, past medical history, past social history, past surgical history and problem list.   Objective:   Filed Vitals:   06/06/15 0920  BP: 124/75  Pulse: 88  Weight: 186 lb (84.369 kg)    Fetal Status: Fetal Heart Rate (bpm): 139   Movement: Present     General:  Alert, oriented and cooperative. Patient is in no acute distress.  Skin: Skin is warm and dry. No rash noted.   Cardiovascular: Normal heart rate noted  Respiratory: Normal respiratory effort, no problems with respiration noted  Abdomen: Soft, gravid, appropriate for gestational age. Pain/Pressure: Absent     Pelvic: Vag. Bleeding: None Vag D/C Character: Thin   Cervical exam deferred        Extremities: Normal range of motion.  Edema: None  Mental Status: Normal mood and affect. Normal behavior. Normal judgment and thought content.   Urinalysis: Urine Protein: Negative Urine Glucose: Negative  Assessment and Plan:  Pregnancy: G1P0 at [redacted]w[redacted]d  1. Encounter for supervision of normal first pregnancy in second trimester - She has a follow up u/s on 06-19-15. - At this time she declines a flu vaccine. I have recommended it.  Preterm labor symptoms and general obstetric precautions including but not limited to vaginal bleeding, contractions, leaking of fluid and fetal movement were reviewed in detail with the patient. Please refer to After Visit Summary for other counseling recommendations.  Return in about 4 weeks (around 07/04/2015).   Allie Bossier, MD

## 2015-06-19 ENCOUNTER — Ambulatory Visit (HOSPITAL_COMMUNITY)
Admission: RE | Admit: 2015-06-19 | Discharge: 2015-06-19 | Disposition: A | Discharge status: Home / Self Care | Payer: Self-pay | Source: Ambulatory Visit | Attending: Obstetrics & Gynecology | Admitting: Obstetrics & Gynecology

## 2015-06-19 DIAGNOSIS — O26872 Cervical shortening, second trimester: Secondary | ICD-10-CM

## 2015-06-19 DIAGNOSIS — Z3689 Encounter for other specified antenatal screening: Secondary | ICD-10-CM

## 2015-06-19 DIAGNOSIS — Z3A23 23 weeks gestation of pregnancy: Secondary | ICD-10-CM

## 2015-06-19 DIAGNOSIS — Z3A33 33 weeks gestation of pregnancy: Secondary | ICD-10-CM | POA: Insufficient documentation

## 2015-06-19 DIAGNOSIS — E669 Obesity, unspecified: Secondary | ICD-10-CM | POA: Insufficient documentation

## 2015-06-19 DIAGNOSIS — O99212 Obesity complicating pregnancy, second trimester: Secondary | ICD-10-CM

## 2015-06-19 DIAGNOSIS — Z36 Encounter for antenatal screening of mother: Secondary | ICD-10-CM | POA: Insufficient documentation

## 2015-06-19 DIAGNOSIS — O4402 Placenta previa specified as without hemorrhage, second trimester: Secondary | ICD-10-CM

## 2015-07-04 ENCOUNTER — Encounter: Payer: Self-pay | Admitting: Family Medicine

## 2015-07-04 ENCOUNTER — Ambulatory Visit (INDEPENDENT_AMBULATORY_CARE_PROVIDER_SITE_OTHER): Payer: Self-pay | Admitting: Family Medicine

## 2015-07-04 VITALS — BP 112/64 | HR 90 | Wt 191.0 lb

## 2015-07-04 DIAGNOSIS — O4412 Placenta previa with hemorrhage, second trimester: Secondary | ICD-10-CM

## 2015-07-04 DIAGNOSIS — Z3402 Encounter for supervision of normal first pregnancy, second trimester: Secondary | ICD-10-CM

## 2015-07-04 DIAGNOSIS — O4402 Placenta previa specified as without hemorrhage, second trimester: Secondary | ICD-10-CM

## 2015-07-04 NOTE — Progress Notes (Signed)
Subjective:  Susan Owens is a 24 y.o. G1P0 at [redacted]w[redacted]d being seen today for ongoing prenatal care.  Patient reports no complaints.  Contractions: Not present.  Vag. Bleeding: None. Movement: Present. Denies leaking of fluid.   The following portions of the patient's history were reviewed and updated as appropriate: allergies, current medications, past family history, past medical history, past social history, past surgical history and problem list.   Objective:   Filed Vitals:   07/04/15 1003  BP: 112/64  Pulse: 90  Weight: 191 lb (86.637 kg)    Fetal Status: Fetal Heart Rate (bpm): 140 Fundal Height: 25 cm Movement: Present     General:  Alert, oriented and cooperative. Patient is in no acute distress.  Skin: Skin is warm and dry. No rash noted.   Cardiovascular: Normal heart rate noted  Respiratory: Normal respiratory effort, no problems with respiration noted  Abdomen: Soft, gravid, appropriate for gestational age. Pain/Pressure: Absent     Pelvic: Vag. Bleeding: None Vag D/C Character: Thin   Cervical exam deferred        Extremities: Normal range of motion.  Edema: None  Mental Status: Normal mood and affect. Normal behavior. Normal judgment and thought content.   Urinalysis: Urine Protein: Trace Urine Glucose: Negative  Assessment and Plan:  Pregnancy: G1P0 at [redacted]w[redacted]d  1. Encounter for supervision of normal first pregnancy in second trimester Continue routine prenatal care.  2. Placenta previa antepartum, second trimester Re-eval in 4 wks - [place] MFM OB FOLLOW UP; Future  Preterm labor symptoms and general obstetric precautions including but not limited to vaginal bleeding, contractions, leaking of fluid and fetal movement were reviewed in detail with the patient. Please refer to After Visit Summary for other counseling recommendations.  Return in 3 weeks (on 07/25/2015).   Reva Bores, MD

## 2015-07-04 NOTE — Patient Instructions (Signed)
Second Trimester of Pregnancy The second trimester is from week 13 through week 28, months 4 through 6. The second trimester is often a time when you feel your best. Your body has also adjusted to being pregnant, and you begin to feel better physically. Usually, morning sickness has lessened or quit completely, you may have more energy, and you may have an increase in appetite. The second trimester is also a time when the fetus is growing rapidly. At the end of the sixth month, the fetus is about 9 inches long and weighs about 1 pounds. You will likely begin to feel the baby move (quickening) between 18 and 20 weeks of the pregnancy. BODY CHANGES Your body goes through many changes during pregnancy. The changes vary from woman to woman.   Your weight will continue to increase. You will notice your lower abdomen bulging out.  You may begin to get stretch marks on your hips, abdomen, and breasts.  You may develop headaches that can be relieved by medicines approved by your health care provider.  You may urinate more often because the fetus is pressing on your bladder.  You may develop or continue to have heartburn as a result of your pregnancy.  You may develop constipation because certain hormones are causing the muscles that push waste through your intestines to slow down.  You may develop hemorrhoids or swollen, bulging veins (varicose veins).  You may have back pain because of the weight gain and pregnancy hormones relaxing your joints between the bones in your pelvis and as a result of a shift in weight and the muscles that support your balance.  Your breasts will continue to grow and be tender.  Your gums may bleed and may be sensitive to brushing and flossing.  Dark spots or blotches (chloasma, mask of pregnancy) may develop on your face. This will likely fade after the baby is born.  A dark line from your belly button to the pubic area (linea nigra) may appear. This will likely  fade after the baby is born.  You may have changes in your hair. These can include thickening of your hair, rapid growth, and changes in texture. Some women also have hair loss during or after pregnancy, or hair that feels dry or thin. Your hair will most likely return to normal after your baby is born. WHAT TO EXPECT AT YOUR PRENATAL VISITS During a routine prenatal visit:  You will be weighed to make sure you and the fetus are growing normally.  Your blood pressure will be taken.  Your abdomen will be measured to track your baby's growth.  The fetal heartbeat will be listened to.  Any test results from the previous visit will be discussed. Your health care provider may ask you:  How you are feeling.  If you are feeling the baby move.  If you have had any abnormal symptoms, such as leaking fluid, bleeding, severe headaches, or abdominal cramping.  If you have any questions. Other tests that may be performed during your second trimester include:  Blood tests that check for:  Low iron levels (anemia).  Gestational diabetes (between 24 and 28 weeks).  Rh antibodies.  Urine tests to check for infections, diabetes, or protein in the urine.  An ultrasound to confirm the proper growth and development of the baby.  An amniocentesis to check for possible genetic problems.  Fetal screens for spina bifida and Down syndrome. HOME CARE INSTRUCTIONS   Avoid all smoking, herbs, alcohol, and unprescribed   drugs. These chemicals affect the formation and growth of the baby.  Follow your health care provider's instructions regarding medicine use. There are medicines that are either safe or unsafe to take during pregnancy.  Exercise only as directed by your health care provider. Experiencing uterine cramps is a good sign to stop exercising.  Continue to eat regular, healthy meals.  Wear a good support bra for breast tenderness.  Do not use hot tubs, steam rooms, or saunas.  Wear  your seat belt at all times when driving.  Avoid raw meat, uncooked cheese, cat litter boxes, and soil used by cats. These carry germs that can cause birth defects in the baby.  Take your prenatal vitamins.  Try taking a stool softener (if your health care provider approves) if you develop constipation. Eat more high-fiber foods, such as fresh vegetables or fruit and whole grains. Drink plenty of fluids to keep your urine clear or pale yellow.  Take warm sitz baths to soothe any pain or discomfort caused by hemorrhoids. Use hemorrhoid cream if your health care provider approves.  If you develop varicose veins, wear support hose. Elevate your feet for 15 minutes, 3-4 times a day. Limit salt in your diet.  Avoid heavy lifting, wear low heel shoes, and practice good posture.  Rest with your legs elevated if you have leg cramps or low back pain.  Visit your dentist if you have not gone yet during your pregnancy. Use a soft toothbrush to brush your teeth and be gentle when you floss.  A sexual relationship may be continued unless your health care provider directs you otherwise.  Continue to go to all your prenatal visits as directed by your health care provider. SEEK MEDICAL CARE IF:   You have dizziness.  You have mild pelvic cramps, pelvic pressure, or nagging pain in the abdominal area.  You have persistent nausea, vomiting, or diarrhea.  You have a bad smelling vaginal discharge.  You have pain with urination. SEEK IMMEDIATE MEDICAL CARE IF:   You have a fever.  You are leaking fluid from your vagina.  You have spotting or bleeding from your vagina.  You have severe abdominal cramping or pain.  You have rapid weight gain or loss.  You have shortness of breath with chest pain.  You notice sudden or extreme swelling of your face, hands, ankles, feet, or legs.  You have not felt your baby move in over an hour.  You have severe headaches that do not go away with  medicine.  You have vision changes. Document Released: 09/10/2001 Document Revised: 09/21/2013 Document Reviewed: 11/17/2012 [employer] Patient Information 2015 [employer]. This information is not intended to replace advice given to you by your health care provider. Make sure you discuss any questions you have with your health care provider.  Breastfeeding Deciding to breastfeed is one of the best choices you can make for you and your baby. A change in hormones during pregnancy causes your breast tissue to grow and increases the number and size of your milk ducts. These hormones also allow proteins, sugars, and fats from your blood supply to make breast milk in your milk-producing glands. Hormones prevent breast milk from being released before your baby is born as well as prompt milk flow after birth. Once breastfeeding has begun, thoughts of your baby, as well as his or her sucking or crying, can stimulate the release of milk from your milk-producing glands.  BENEFITS OF BREASTFEEDING For Your Baby  Your first   milk (colostrum) helps your baby's digestive system function better.   There are antibodies in your milk that help your baby fight off infections.   Your baby has a lower incidence of asthma, allergies, and sudden infant death syndrome.   The nutrients in breast milk are better for your baby than infant formulas and are designed uniquely for your baby's needs.   Breast milk improves your baby's brain development.   Your baby is less likely to develop other conditions, such as childhood obesity, asthma, or type 2 diabetes mellitus.  For You   Breastfeeding helps to create a very special bond between you and your baby.   Breastfeeding is convenient. Breast milk is always available at the correct temperature and costs nothing.   Breastfeeding helps to burn calories and helps you lose the weight gained during pregnancy.   Breastfeeding makes your uterus contract to its  prepregnancy size faster and slows bleeding (lochia) after you give birth.   Breastfeeding helps to lower your risk of developing type 2 diabetes mellitus, osteoporosis, and breast or ovarian cancer later in life. SIGNS THAT YOUR BABY IS HUNGRY Early Signs of Hunger  Increased alertness or activity.  Stretching.  Movement of the head from side to side.  Movement of the head and opening of the mouth when the corner of the mouth or cheek is stroked (rooting).  Increased sucking sounds, smacking lips, cooing, sighing, or squeaking.  Hand-to-mouth movements.  Increased sucking of fingers or hands. Late Signs of Hunger  Fussing.  Intermittent crying. Extreme Signs of Hunger Signs of extreme hunger will require calming and consoling before your baby will be able to breastfeed successfully. Do not wait for the following signs of extreme hunger to occur before you initiate breastfeeding:   Restlessness.  A loud, strong cry.   Screaming. BREASTFEEDING BASICS Breastfeeding Initiation  Find a comfortable place to sit or lie down, with your neck and back well supported.  Place a pillow or rolled up blanket under your baby to bring him or her to the level of your breast (if you are seated). Nursing pillows are specially designed to help support your arms and your baby while you breastfeed.  Make sure that your baby's abdomen is facing your abdomen.   Gently massage your breast. With your fingertips, massage from your chest wall toward your nipple in a circular motion. This encourages milk flow. You may need to continue this action during the feeding if your milk flows slowly.  Support your breast with 4 fingers underneath and your thumb above your nipple. Make sure your fingers are well away from your nipple and your baby's mouth.   Stroke your baby's lips gently with your finger or nipple.   When your baby's mouth is open wide enough, quickly bring your baby to your breast,  placing your entire nipple and as much of the colored area around your nipple (areola) as possible into your baby's mouth.   More areola should be visible above your baby's upper lip than below the lower lip.   Your baby's tongue should be between his or her lower gum and your breast.   Ensure that your baby's mouth is correctly positioned around your nipple (latched). Your baby's lips should create a seal on your breast and be turned out (everted).  It is common for your baby to suck about 2-3 minutes in order to start the flow of breast milk. Latching Teaching your baby how to latch on to your breast   properly is very important. An improper latch can cause nipple pain and decreased milk supply for you and poor weight gain in your baby. Also, if your baby is not latched onto your nipple properly, he or she may swallow some air during feeding. This can make your baby fussy. Burping your baby when you switch breasts during the feeding can help to get rid of the air. However, teaching your baby to latch on properly is still the best way to prevent fussiness from swallowing air while breastfeeding. Signs that your baby has successfully latched on to your nipple:    Silent tugging or silent sucking, without causing you pain.   Swallowing heard between every 3-4 sucks.    Muscle movement above and in front of his or her ears while sucking.  Signs that your baby has not successfully latched on to nipple:   Sucking sounds or smacking sounds from your baby while breastfeeding.  Nipple pain. If you think your baby has not latched on correctly, slip your finger into the corner of your baby's mouth to break the suction and place it between your baby's gums. Attempt breastfeeding initiation again. Signs of Successful Breastfeeding Signs from your baby:   A gradual decrease in the number of sucks or complete cessation of sucking.   Falling asleep.   Relaxation of his or her body.    Retention of a small amount of milk in his or her mouth.   Letting go of your breast by himself or herself. Signs from you:  Breasts that have increased in firmness, weight, and size 1-3 hours after feeding.   Breasts that are softer immediately after breastfeeding.  Increased milk volume, as well as a change in milk consistency and color by the fifth day of breastfeeding.   Nipples that are not sore, cracked, or bleeding. Signs That Your Baby is Getting Enough Milk  Wetting at least 3 diapers in a 24-hour period. The urine should be clear and pale yellow by age 5 days.  At least 3 stools in a 24-hour period by age 5 days. The stool should be soft and yellow.  At least 3 stools in a 24-hour period by age 7 days. The stool should be seedy and yellow.  No loss of weight greater than 10% of birth weight during the first 3 days of age.  Average weight gain of 4-7 ounces (113-198 g) per week after age 4 days.  Consistent daily weight gain by age 5 days, without weight loss after the age of 2 weeks. After a feeding, your baby may spit up a small amount. This is common. BREASTFEEDING FREQUENCY AND DURATION Frequent feeding will help you make more milk and can prevent sore nipples and breast engorgement. Breastfeed when you feel the need to reduce the fullness of your breasts or when your baby shows signs of hunger. This is called "breastfeeding on demand." Avoid introducing a pacifier to your baby while you are working to establish breastfeeding (the first 4-6 weeks after your baby is born). After this time you may choose to use a pacifier. Research has shown that pacifier use during the first year of a baby's life decreases the risk of sudden infant death syndrome (SIDS). Allow your baby to feed on each breast as long as he or she wants. Breastfeed until your baby is finished feeding. When your baby unlatches or falls asleep while feeding from the first breast, offer the second breast.  Because newborns are often sleepy in the   first few weeks of life, you may need to awaken your baby to get him or her to feed. Breastfeeding times will vary from baby to baby. However, the following rules can serve as a guide to help you ensure that your baby is properly fed:  Newborns (babies 4 weeks of age or younger) may breastfeed every 1-3 hours.  Newborns should not go longer than 3 hours during the day or 5 hours during the night without breastfeeding.  You should breastfeed your baby a minimum of 8 times in a 24-hour period until you begin to introduce solid foods to your baby at around 6 months of age. BREAST MILK PUMPING Pumping and storing breast milk allows you to ensure that your baby is exclusively fed your breast milk, even at times when you are unable to breastfeed. This is especially important if you are going back to work while you are still breastfeeding or when you are not able to be present during feedings. Your lactation consultant can give you guidelines on how long it is safe to store breast milk.  A breast pump is a machine that allows you to pump milk from your breast into a sterile bottle. The pumped breast milk can then be stored in a refrigerator or freezer. Some breast pumps are operated by hand, while others use electricity. Ask your lactation consultant which type will work best for you. Breast pumps can be purchased, but some hospitals and breastfeeding support groups lease breast pumps on a monthly basis. A lactation consultant can teach you how to hand express breast milk, if you prefer not to use a pump.  CARING FOR YOUR BREASTS WHILE YOU BREASTFEED Nipples can become dry, cracked, and sore while breastfeeding. The following recommendations can help keep your breasts moisturized and healthy:  Avoid using soap on your nipples.   Wear a supportive bra. Although not required, special nursing bras and tank tops are designed to allow access to your breasts for  breastfeeding without taking off your entire bra or top. Avoid wearing underwire-style bras or extremely tight bras.  Air dry your nipples for 3-4minutes after each feeding.   Use only cotton bra pads to absorb leaked breast milk. Leaking of breast milk between feedings is normal.   Use lanolin on your nipples after breastfeeding. Lanolin helps to maintain your skin's normal moisture barrier. If you use pure lanolin, you do not need to wash it off before feeding your baby again. Pure lanolin is not toxic to your baby. You may also hand express a few drops of breast milk and gently massage that milk into your nipples and allow the milk to air dry. In the first few weeks after giving birth, some women experience extremely full breasts (engorgement). Engorgement can make your breasts feel heavy, warm, and tender to the touch. Engorgement peaks within 3-5 days after you give birth. The following recommendations can help ease engorgement:  Completely empty your breasts while breastfeeding or pumping. You may want to start by applying warm, moist heat (in the shower or with warm water-soaked hand towels) just before feeding or pumping. This increases circulation and helps the milk flow. If your baby does not completely empty your breasts while breastfeeding, pump any extra milk after he or she is finished.  Wear a snug bra (nursing or regular) or tank top for 1-2 days to signal your body to slightly decrease milk production.  Apply ice packs to your breasts, unless this is too uncomfortable for you.    Make sure that your baby is latched on and positioned properly while breastfeeding. If engorgement persists after 48 hours of following these recommendations, contact your health care provider or a lactation consultant. OVERALL HEALTH CARE RECOMMENDATIONS WHILE BREASTFEEDING  Eat healthy foods. Alternate between meals and snacks, eating 3 of each per day. Because what you eat affects your breast milk,  some of the foods may make your baby more irritable than usual. Avoid eating these foods if you are sure that they are negatively affecting your baby.  Drink milk, fruit juice, and water to satisfy your thirst (about 10 glasses a day).   Rest often, relax, and continue to take your prenatal vitamins to prevent fatigue, stress, and anemia.  Continue breast self-awareness checks.  Avoid chewing and smoking tobacco.  Avoid alcohol and drug use. Some medicines that may be harmful to your baby can pass through breast milk. It is important to ask your health care provider before taking any medicine, including all over-the-counter and prescription medicine as well as vitamin and herbal supplements. It is possible to become pregnant while breastfeeding. If birth control is desired, ask your health care provider about options that will be safe for your baby. SEEK MEDICAL CARE IF:   You feel like you want to stop breastfeeding or have become frustrated with breastfeeding.  You have painful breasts or nipples.  Your nipples are cracked or bleeding.  Your breasts are red, tender, or warm.  You have a swollen area on either breast.  You have a fever or chills.  You have nausea or vomiting.  You have drainage other than breast milk from your nipples.  Your breasts do not become full before feedings by the fifth day after you give birth.  You feel sad and depressed.  Your baby is too sleepy to eat well.  Your baby is having trouble sleeping.   Your baby is wetting less than 3 diapers in a 24-hour period.  Your baby has less than 3 stools in a 24-hour period.  Your baby's skin or the white part of his or her eyes becomes yellow.   Your baby is not gaining weight by 5 days of age. SEEK IMMEDIATE MEDICAL CARE IF:   Your baby is overly tired (lethargic) and does not want to wake up and feed.  Your baby develops an unexplained fever. Document Released: 09/16/2005 Document Revised:  09/21/2013 Document Reviewed: 03/10/2013 [employer] Patient Information 2015 [employer]. This information is not intended to replace advice given to you by your health care provider. Make sure you discuss any questions you have with your health care provider.  

## 2015-07-25 ENCOUNTER — Ambulatory Visit (INDEPENDENT_AMBULATORY_CARE_PROVIDER_SITE_OTHER): Payer: Self-pay | Admitting: Family Medicine

## 2015-07-25 VITALS — BP 126/77 | HR 93 | Wt 194.0 lb

## 2015-07-25 DIAGNOSIS — Z3402 Encounter for supervision of normal first pregnancy, second trimester: Secondary | ICD-10-CM

## 2015-07-25 DIAGNOSIS — Z23 Encounter for immunization: Secondary | ICD-10-CM

## 2015-07-25 LAB — CBC
HCT: 35.2 % — ABNORMAL LOW (ref 36.0–46.0)
HEMOGLOBIN: 11.8 g/dL — AB (ref 12.0–15.0)
MCH: 30.7 pg (ref 26.0–34.0)
MCHC: 33.5 g/dL (ref 30.0–36.0)
MCV: 91.7 fL (ref 78.0–100.0)
MPV: 9.2 fL (ref 8.6–12.4)
Platelets: 342 10*3/uL (ref 150–400)
RBC: 3.84 MIL/uL — AB (ref 3.87–5.11)
RDW: 12.8 % (ref 11.5–15.5)
WBC: 9.4 10*3/uL (ref 4.0–10.5)

## 2015-07-25 NOTE — Patient Instructions (Signed)
Third Trimester of Pregnancy The third trimester is from week 29 through week 42, months 7 through 9. The third trimester is a time when the fetus is growing rapidly. At the end of the ninth month, the fetus is about 20 inches in length and weighs 6-10 pounds.  BODY CHANGES Your body goes through many changes during pregnancy. The changes vary from woman to woman.   Your weight will continue to increase. You can expect to gain 25-35 pounds (11-16 kg) by the end of the pregnancy.  You may begin to get stretch marks on your hips, abdomen, and breasts.  You may urinate more often because the fetus is moving lower into your pelvis and pressing on your bladder.  You may develop or continue to have heartburn as a result of your pregnancy.  You may develop constipation because certain hormones are causing the muscles that push waste through your intestines to slow down.  You may develop hemorrhoids or swollen, bulging veins (varicose veins).  You may have pelvic pain because of the weight gain and pregnancy hormones relaxing your joints between the bones in your pelvis. Backaches may result from overexertion of the muscles supporting your posture.  You may have changes in your hair. These can include thickening of your hair, rapid growth, and changes in texture. Some women also have hair loss during or after pregnancy, or hair that feels dry or thin. Your hair will most likely return to normal after your baby is born.  Your breasts will continue to grow and be tender. A yellow discharge may leak from your breasts called colostrum.  Your belly button may stick out.  You may feel short of breath because of your expanding uterus.  You may notice the fetus "dropping," or moving lower in your abdomen.  You may have a bloody mucus discharge. This usually occurs a few days to a week before labor begins.  Your cervix becomes thin and soft (effaced) near your due date. WHAT TO EXPECT AT YOUR  PRENATAL EXAMS  You will have prenatal exams every 2 weeks until week 36. Then, you will have weekly prenatal exams. During a routine prenatal visit:  You will be weighed to make sure you and the fetus are growing normally.  Your blood pressure is taken.  Your abdomen will be measured to track your baby's growth.  The fetal heartbeat will be listened to.  Any test results from the previous visit will be discussed.  You may have a cervical check near your due date to see if you have effaced. At around 36 weeks, your caregiver will check your cervix. At the same time, your caregiver will also perform a test on the secretions of the vaginal tissue. This test is to determine if a type of bacteria, Group B streptococcus, is present. Your caregiver will explain this further. Your caregiver may ask you:  What your birth plan is.  How you are feeling.  If you are feeling the baby move.  If you have had any abnormal symptoms, such as leaking fluid, bleeding, severe headaches, or abdominal cramping.  If you are using any tobacco products, including cigarettes, chewing tobacco, and electronic cigarettes.  If you have any questions. Other tests or screenings that may be performed during your third trimester include:  Blood tests that check for low iron levels (anemia).  Fetal testing to check the health, activity level, and growth of the fetus. Testing is done if you have certain medical conditions or if   there are problems during the pregnancy.  HIV (human immunodeficiency virus) testing. If you are at high risk, you may be screened for HIV during your third trimester of pregnancy. FALSE LABOR You may feel small, irregular contractions that eventually go away. These are called Braxton Hicks contractions, or false labor. Contractions may last for hours, days, or even weeks before true labor sets in. If contractions come at regular intervals, intensify, or become painful, it is best to be seen  by your caregiver.  SIGNS OF LABOR   Menstrual-like cramps.  Contractions that are 5 minutes apart or less.  Contractions that start on the top of the uterus and spread down to the lower abdomen and back.  A sense of increased pelvic pressure or back pain.  A watery or bloody mucus discharge that comes from the vagina. If you have any of these signs before the 37th week of pregnancy, call your caregiver right away. You need to go to the hospital to get checked immediately. HOME CARE INSTRUCTIONS   Avoid all smoking, herbs, alcohol, and unprescribed drugs. These chemicals affect the formation and growth of the baby.  Do not use any tobacco products, including cigarettes, chewing tobacco, and electronic cigarettes. If you need help quitting, ask your health care provider. You may receive counseling support and other resources to help you quit.  Follow your caregiver's instructions regarding medicine use. There are medicines that are either safe or unsafe to take during pregnancy.  Exercise only as directed by your caregiver. Experiencing uterine cramps is a good sign to stop exercising.  Continue to eat regular, healthy meals.  Wear a good support bra for breast tenderness.  Do not use hot tubs, steam rooms, or saunas.  Wear your seat belt at all times when driving.  Avoid raw meat, uncooked cheese, cat litter boxes, and soil used by cats. These carry germs that can cause birth defects in the baby.  Take your prenatal vitamins.  Take 1500-2000 mg of calcium daily starting at the 20th week of pregnancy until you deliver your baby.  Try taking a stool softener (if your caregiver approves) if you develop constipation. Eat more high-fiber foods, such as fresh vegetables or fruit and whole grains. Drink plenty of fluids to keep your urine clear or pale yellow.  Take warm sitz baths to soothe any pain or discomfort caused by hemorrhoids. Use hemorrhoid cream if your caregiver  approves.  If you develop varicose veins, wear support hose. Elevate your feet for 15 minutes, 3-4 times a day. Limit salt in your diet.  Avoid heavy lifting, wear low heal shoes, and practice good posture.  Rest a lot with your legs elevated if you have leg cramps or low back pain.  Visit your dentist if you have not gone during your pregnancy. Use a soft toothbrush to brush your teeth and be gentle when you floss.  A sexual relationship may be continued unless your caregiver directs you otherwise.  Do not travel far distances unless it is absolutely necessary and only with the approval of your caregiver.  Take prenatal classes to understand, practice, and ask questions about the labor and delivery.  Make a trial run to the hospital.  Pack your hospital bag.  Prepare the baby's nursery.  Continue to go to all your prenatal visits as directed by your caregiver. SEEK MEDICAL CARE IF:  You are unsure if you are in labor or if your water has broken.  You have dizziness.  You have   mild pelvic cramps, pelvic pressure, or nagging pain in your abdominal area.  You have persistent nausea, vomiting, or diarrhea.  You have a bad smelling vaginal discharge.  You have pain with urination. SEEK IMMEDIATE MEDICAL CARE IF:   You have a fever.  You are leaking fluid from your vagina.  You have spotting or bleeding from your vagina.  You have severe abdominal cramping or pain.  You have rapid weight loss or gain.  You have shortness of breath with chest pain.  You notice sudden or extreme swelling of your face, hands, ankles, feet, or legs.  You have not felt your baby move in over an hour.  You have severe headaches that do not go away with medicine.  You have vision changes.   This information is not intended to replace advice given to you by your health care provider. Make sure you discuss any questions you have with your health care provider.   Document Released:  09/10/2001 Document Revised: 10/07/2014 Document Reviewed: 11/17/2012 Elsevier Interactive Patient Education 2016 [employer]  Breastfeeding Deciding to breastfeed is one of the best choices you can make for you and your baby. A change in hormones during pregnancy causes your breast tissue to grow and increases the number and size of your milk ducts. These hormones also allow proteins, sugars, and fats from your blood supply to make breast milk in your milk-producing glands. Hormones prevent breast milk from being released before your baby is born as well as prompt milk flow after birth. Once breastfeeding has begun, thoughts of your baby, as well as his or her sucking or crying, can stimulate the release of milk from your milk-producing glands.  BENEFITS OF BREASTFEEDING For Your Baby  Your first milk (colostrum) helps your baby's digestive system function better.  There are antibodies in your milk that help your baby fight off infections.  Your baby has a lower incidence of asthma, allergies, and sudden infant death syndrome.  The nutrients in breast milk are better for your baby than infant formulas and are designed uniquely for your baby's needs.  Breast milk improves your baby's brain development.  Your baby is less likely to develop other conditions, such as childhood obesity, asthma, or type 2 diabetes mellitus. For You  Breastfeeding helps to create a very special bond between you and your baby.  Breastfeeding is convenient. Breast milk is always available at the correct temperature and costs nothing.  Breastfeeding helps to burn calories and helps you lose the weight gained during pregnancy.  Breastfeeding makes your uterus contract to its prepregnancy size faster and slows bleeding (lochia) after you give birth.   Breastfeeding helps to lower your risk of developing type 2 diabetes mellitus, osteoporosis, and breast or ovarian cancer later in life. SIGNS THAT YOUR BABY IS  HUNGRY Early Signs of Hunger  Increased alertness or activity.  Stretching.  Movement of the head from side to side.  Movement of the head and opening of the mouth when the corner of the mouth or cheek is stroked (rooting).  Increased sucking sounds, smacking lips, cooing, sighing, or squeaking.  Hand-to-mouth movements.  Increased sucking of fingers or hands. Late Signs of Hunger  Fussing.  Intermittent crying. Extreme Signs of Hunger Signs of extreme hunger will require calming and consoling before your baby will be able to breastfeed successfully. Do not wait for the following signs of extreme hunger to occur before you initiate breastfeeding:  Restlessness.  A loud, strong cry.  Screaming.   BREASTFEEDING BASICS Breastfeeding Initiation  Find a comfortable place to sit or lie down, with your neck and back well supported.  Place a pillow or rolled up blanket under your baby to bring him or her to the level of your breast (if you are seated). Nursing pillows are specially designed to help support your arms and your baby while you breastfeed.  Make sure that your baby's abdomen is facing your abdomen.  Gently massage your breast. With your fingertips, massage from your chest wall toward your nipple in a circular motion. This encourages milk flow. You may need to continue this action during the feeding if your milk flows slowly.  Support your breast with 4 fingers underneath and your thumb above your nipple. Make sure your fingers are well away from your nipple and your baby's mouth.  Stroke your baby's lips gently with your finger or nipple.  When your baby's mouth is open wide enough, quickly bring your baby to your breast, placing your entire nipple and as much of the colored area around your nipple (areola) as possible into your baby's mouth.  More areola should be visible above your baby's upper lip than below the lower lip.  Your baby's tongue should be between his  or her lower gum and your breast.  Ensure that your baby's mouth is correctly positioned around your nipple (latched). Your baby's lips should create a seal on your breast and be turned out (everted).  It is common for your baby to suck about 2-3 minutes in order to start the flow of breast milk. Latching Teaching your baby how to latch on to your breast properly is very important. An improper latch can cause nipple pain and decreased milk supply for you and poor weight gain in your baby. Also, if your baby is not latched onto your nipple properly, he or she may swallow some air during feeding. This can make your baby fussy. Burping your baby when you switch breasts during the feeding can help to get rid of the air. However, teaching your baby to latch on properly is still the best way to prevent fussiness from swallowing air while breastfeeding. Signs that your baby has successfully latched on to your nipple:  Silent tugging or silent sucking, without causing you pain.  Swallowing heard between every 3-4 sucks.  Muscle movement above and in front of his or her ears while sucking. Signs that your baby has not successfully latched on to nipple:  Sucking sounds or smacking sounds from your baby while breastfeeding.  Nipple pain. If you think your baby has not latched on correctly, slip your finger into the corner of your baby's mouth to break the suction and place it between your baby's gums. Attempt breastfeeding initiation again. Signs of Successful Breastfeeding Signs from your baby:  A gradual decrease in the number of sucks or complete cessation of sucking.  Falling asleep.  Relaxation of his or her body.  Retention of a small amount of milk in his or her mouth.  Letting go of your breast by himself or herself. Signs from you:  Breasts that have increased in firmness, weight, and size 1-3 hours after feeding.  Breasts that are softer immediately after  breastfeeding.  Increased milk volume, as well as a change in milk consistency and color by the fifth day of breastfeeding.  Nipples that are not sore, cracked, or bleeding. Signs That Your Baby is Getting Enough Milk  Wetting at least 3 diapers in a 24-hour period.   The urine should be clear and pale yellow by age 5 days.  At least 3 stools in a 24-hour period by age 5 days. The stool should be soft and yellow.  At least 3 stools in a 24-hour period by age 7 days. The stool should be seedy and yellow.  No loss of weight greater than 10% of birth weight during the first 3 days of age.  Average weight gain of 4-7 ounces (113-198 g) per week after age 4 days.  Consistent daily weight gain by age 5 days, without weight loss after the age of 2 weeks. After a feeding, your baby may spit up a small amount. This is common. BREASTFEEDING FREQUENCY AND DURATION Frequent feeding will help you make more milk and can prevent sore nipples and breast engorgement. Breastfeed when you feel the need to reduce the fullness of your breasts or when your baby shows signs of hunger. This is called "breastfeeding on demand." Avoid introducing a pacifier to your baby while you are working to establish breastfeeding (the first 4-6 weeks after your baby is born). After this time you may choose to use a pacifier. Research has shown that pacifier use during the first year of a baby's life decreases the risk of sudden infant death syndrome (SIDS). Allow your baby to feed on each breast as long as he or she wants. Breastfeed until your baby is finished feeding. When your baby unlatches or falls asleep while feeding from the first breast, offer the second breast. Because newborns are often sleepy in the first few weeks of life, you may need to awaken your baby to get him or her to feed. Breastfeeding times will vary from baby to baby. However, the following rules can serve as a guide to help you ensure that your baby is  properly fed:  Newborns (babies 4 weeks of age or younger) may breastfeed every 1-3 hours.  Newborns should not go longer than 3 hours during the day or 5 hours during the night without breastfeeding.  You should breastfeed your baby a minimum of 8 times in a 24-hour period until you begin to introduce solid foods to your baby at around 6 months of age. BREAST MILK PUMPING Pumping and storing breast milk allows you to ensure that your baby is exclusively fed your breast milk, even at times when you are unable to breastfeed. This is especially important if you are going back to work while you are still breastfeeding or when you are not able to be present during feedings. Your lactation consultant can give you guidelines on how long it is safe to store breast milk. A breast pump is a machine that allows you to pump milk from your breast into a sterile bottle. The pumped breast milk can then be stored in a refrigerator or freezer. Some breast pumps are operated by hand, while others use electricity. Ask your lactation consultant which type will work best for you. Breast pumps can be purchased, but some hospitals and breastfeeding support groups lease breast pumps on a monthly basis. A lactation consultant can teach you how to hand express breast milk, if you prefer not to use a pump. CARING FOR YOUR BREASTS WHILE YOU BREASTFEED Nipples can become dry, cracked, and sore while breastfeeding. The following recommendations can help keep your breasts moisturized and healthy:  Avoid using soap on your nipples.  Wear a supportive bra. Although not required, special nursing bras and tank tops are designed to allow access to your   breasts for breastfeeding without taking off your entire bra or top. Avoid wearing underwire-style bras or extremely tight bras.  Air dry your nipples for 3-4minutes after each feeding.  Use only cotton bra pads to absorb leaked breast milk. Leaking of breast milk between feedings  is normal.  Use lanolin on your nipples after breastfeeding. Lanolin helps to maintain your skin's normal moisture barrier. If you use pure lanolin, you do not need to wash it off before feeding your baby again. Pure lanolin is not toxic to your baby. You may also hand express a few drops of breast milk and gently massage that milk into your nipples and allow the milk to air dry. In the first few weeks after giving birth, some women experience extremely full breasts (engorgement). Engorgement can make your breasts feel heavy, warm, and tender to the touch. Engorgement peaks within 3-5 days after you give birth. The following recommendations can help ease engorgement:  Completely empty your breasts while breastfeeding or pumping. You may want to start by applying warm, moist heat (in the shower or with warm water-soaked hand towels) just before feeding or pumping. This increases circulation and helps the milk flow. If your baby does not completely empty your breasts while breastfeeding, pump any extra milk after he or she is finished.  Wear a snug bra (nursing or regular) or tank top for 1-2 days to signal your body to slightly decrease milk production.  Apply ice packs to your breasts, unless this is too uncomfortable for you.  Make sure that your baby is latched on and positioned properly while breastfeeding. If engorgement persists after 48 hours of following these recommendations, contact your health care provider or a lactation consultant. OVERALL HEALTH CARE RECOMMENDATIONS WHILE BREASTFEEDING  Eat healthy foods. Alternate between meals and snacks, eating 3 of each per day. Because what you eat affects your breast milk, some of the foods may make your baby more irritable than usual. Avoid eating these foods if you are sure that they are negatively affecting your baby.  Drink milk, fruit juice, and water to satisfy your thirst (about 10 glasses a day).  Rest often, relax, and continue to take  your prenatal vitamins to prevent fatigue, stress, and anemia.  Continue breast self-awareness checks.  Avoid chewing and smoking tobacco. Chemicals from cigarettes that pass into breast milk and exposure to secondhand smoke may harm your baby.  Avoid alcohol and drug use, including marijuana. Some medicines that may be harmful to your baby can pass through breast milk. It is important to ask your health care provider before taking any medicine, including all over-the-counter and prescription medicine as well as vitamin and herbal supplements. It is possible to become pregnant while breastfeeding. If birth control is desired, ask your health care provider about options that will be safe for your baby. SEEK MEDICAL CARE IF:  You feel like you want to stop breastfeeding or have become frustrated with breastfeeding.  You have painful breasts or nipples.  Your nipples are cracked or bleeding.  Your breasts are red, tender, or warm.  You have a swollen area on either breast.  You have a fever or chills.  You have nausea or vomiting.  You have drainage other than breast milk from your nipples.  Your breasts do not become full before feedings by the fifth day after you give birth.  You feel sad and depressed.  Your baby is too sleepy to eat well.  Your baby is having trouble sleeping.     Your baby is wetting less than 3 diapers in a 24-hour period.  Your baby has less than 3 stools in a 24-hour period.  Your baby's skin or the white part of his or her eyes becomes yellow.   Your baby is not gaining weight by 5 days of age. SEEK IMMEDIATE MEDICAL CARE IF:  Your baby is overly tired (lethargic) and does not want to wake up and feed.  Your baby develops an unexplained fever.   This information is not intended to replace advice given to you by your health care provider. Make sure you discuss any questions you have with your health care provider.   Document Released: 09/16/2005  Document Revised: 06/07/2015 Document Reviewed: 03/10/2013 Elsevier Interactive Patient Education 2016 [employer]  

## 2015-07-25 NOTE — Progress Notes (Signed)
Subjective:  Susan Owens is a 24 y.o. G1P0 at 5364w1d being seen today for ongoing prenatal care.  Patient reports no complaints.  Contractions: Not present.  Vag. Bleeding: None. Movement: Present. Denies leaking of fluid.   The following portions of the patient'Susan history were reviewed and updated as appropriate: allergies, current medications, past family history, past medical history, past social history, past surgical history and problem list. Problem list updated.  Objective:   Filed Vitals:   07/25/15 0918  BP: 126/77  Pulse: 93  Weight: 194 lb (87.998 kg)    Fetal Status: Fetal Heart Rate (bpm): 139 Fundal Height: 28 cm Movement: Present     General:  Alert, oriented and cooperative. Patient is in no acute distress.  Skin: Skin is warm and dry. No rash noted.   Cardiovascular: Normal heart rate noted  Respiratory: Normal respiratory effort, no problems with respiration noted  Abdomen: Soft, gravid, appropriate for gestational age. Pain/Pressure: Absent     Pelvic: Vag. Bleeding: None Vag D/C Character: Thin   Cervical exam deferred        Extremities: Normal range of motion.  Edema: None  Mental Status: Normal mood and affect. Normal behavior. Normal judgment and thought content.   Urinalysis: Urine Protein: Trace Urine Glucose: Negative  Assessment and Plan:  Pregnancy: G1P0 at 5864w1d  1. Encounter for supervision of normal first pregnancy in second trimester Continue routine prenatal care.  - Flu Vaccine QUAD 36+ mos IM (Fluarix, Quad PF) - Tdap vaccine greater than or equal to 7yo IM - CBC - HIV antibody - RPR - Glucose Tolerance, 1 HR (50g)  Preterm labor symptoms and general obstetric precautions including but not limited to vaginal bleeding, contractions, leaking of fluid and fetal movement were reviewed in detail with the patient. Please refer to After Visit Summary for other counseling recommendations.  Return in 2 weeks (on 08/08/2015).   Susan Owens  Susan Kayly Kriegel, MD

## 2015-07-26 LAB — GLUCOSE TOLERANCE, 1 HOUR (50G) W/O FASTING: Glucose, 1 Hour GTT: 86 mg/dL (ref 70–140)

## 2015-07-26 LAB — HIV ANTIBODY (ROUTINE TESTING W REFLEX): HIV: NONREACTIVE

## 2015-07-27 LAB — RPR

## 2015-08-01 ENCOUNTER — Other Ambulatory Visit: Payer: Self-pay | Admitting: Family Medicine

## 2015-08-01 ENCOUNTER — Ambulatory Visit (HOSPITAL_COMMUNITY)
Admission: RE | Admit: 2015-08-01 | Discharge: 2015-08-01 | Disposition: A | Discharge status: Home / Self Care | Payer: Self-pay | Source: Ambulatory Visit | Attending: Family Medicine | Admitting: Family Medicine

## 2015-08-01 ENCOUNTER — Encounter (HOSPITAL_COMMUNITY): Payer: Self-pay

## 2015-08-01 VITALS — BP 118/69 | HR 80 | Wt 196.2 lb

## 2015-08-01 DIAGNOSIS — O4403 Placenta previa specified as without hemorrhage, third trimester: Secondary | ICD-10-CM

## 2015-08-01 DIAGNOSIS — Z3A29 29 weeks gestation of pregnancy: Secondary | ICD-10-CM

## 2015-08-01 DIAGNOSIS — O99213 Obesity complicating pregnancy, third trimester: Secondary | ICD-10-CM

## 2015-08-01 DIAGNOSIS — O4402 Placenta previa specified as without hemorrhage, second trimester: Secondary | ICD-10-CM

## 2015-08-01 DIAGNOSIS — IMO0002 Reserved for concepts with insufficient information to code with codable children: Secondary | ICD-10-CM

## 2015-08-08 ENCOUNTER — Encounter: Payer: Self-pay | Admitting: Obstetrics & Gynecology

## 2015-08-08 ENCOUNTER — Ambulatory Visit (INDEPENDENT_AMBULATORY_CARE_PROVIDER_SITE_OTHER): Payer: Self-pay | Admitting: Obstetrics & Gynecology

## 2015-08-08 VITALS — BP 116/68 | HR 87 | Wt 195.0 lb

## 2015-08-08 DIAGNOSIS — Z3403 Encounter for supervision of normal first pregnancy, third trimester: Secondary | ICD-10-CM

## 2015-08-08 NOTE — Progress Notes (Signed)
Subjective:  Susan Owens is a 24 y.o. H G1P0 at 394w1d being seen today for ongoing prenatal care.  Patient reports no complaints.  Contractions: Not present.  Vag. Bleeding: None. Movement: Present. Denies leaking of fluid.   The following portions of the patient's history were reviewed and updated as appropriate: allergies, current medications, past family history, past medical history, past social history, past surgical history and problem list. Problem list updated.  Objective:   Filed Vitals:   08/08/15 0902  BP: 116/68  Pulse: 87  Weight: 195 lb (88.451 kg)    Fetal Status: Fetal Heart Rate (bpm): 138 Fundal Height: 30 cm Movement: Present     General:  Alert, oriented and cooperative. Patient is in no acute distress.  Skin: Skin is warm and dry. No rash noted.   Cardiovascular: Normal heart rate noted  Respiratory: Normal respiratory effort, no problems with respiration noted  Abdomen: Soft, gravid, appropriate for gestational age. Pain/Pressure: Absent     Pelvic: Vag. Bleeding: None Vag D/C Character: Thin   Cervical exam deferred        Extremities: Normal range of motion.  Edema: None  Mental Status: Normal mood and affect. Normal behavior. Normal judgment and thought content.   Urinalysis: Urine Protein: Negative Urine Glucose: Negative  Assessment and Plan:  Pregnancy: G1P0 at 384w1d  1. Encounter for supervision of normal first pregnancy in third trimester   Preterm labor symptoms and general obstetric precautions including but not limited to vaginal bleeding, contractions, leaking of fluid and fetal movement were reviewed in detail with the patient. Please refer to After Visit Summary for other counseling recommendations.  Return in about 2 weeks (around 08/22/2015).   Allie BossierMyra C Eppie Barhorst, MD

## 2015-08-22 ENCOUNTER — Ambulatory Visit (HOSPITAL_COMMUNITY)
Admission: RE | Admit: 2015-08-22 | Discharge: 2015-08-22 | Disposition: A | Discharge status: Home / Self Care | Payer: Self-pay | Source: Ambulatory Visit | Attending: Obstetrics & Gynecology | Admitting: Obstetrics & Gynecology

## 2015-08-22 ENCOUNTER — Ambulatory Visit (HOSPITAL_COMMUNITY)
Admission: RE | Admit: 2015-08-22 | Discharge: 2015-08-22 | Disposition: A | Discharge status: Home / Self Care | Payer: Self-pay | Source: Ambulatory Visit

## 2015-08-22 ENCOUNTER — Other Ambulatory Visit (HOSPITAL_COMMUNITY): Payer: Self-pay | Admitting: Maternal and Fetal Medicine

## 2015-08-22 ENCOUNTER — Ambulatory Visit (INDEPENDENT_AMBULATORY_CARE_PROVIDER_SITE_OTHER): Payer: Self-pay | Admitting: Obstetrics & Gynecology

## 2015-08-22 VITALS — BP 108/71 | HR 96 | Wt 197.0 lb

## 2015-08-22 DIAGNOSIS — IMO0002 Reserved for concepts with insufficient information to code with codable children: Secondary | ICD-10-CM

## 2015-08-22 DIAGNOSIS — Z3A32 32 weeks gestation of pregnancy: Secondary | ICD-10-CM | POA: Insufficient documentation

## 2015-08-22 DIAGNOSIS — E669 Obesity, unspecified: Secondary | ICD-10-CM

## 2015-08-22 DIAGNOSIS — O4443 Low lying placenta NOS or without hemorrhage, third trimester: Secondary | ICD-10-CM

## 2015-08-22 DIAGNOSIS — Z3403 Encounter for supervision of normal first pregnancy, third trimester: Secondary | ICD-10-CM

## 2015-08-22 DIAGNOSIS — O99212 Obesity complicating pregnancy, second trimester: Secondary | ICD-10-CM

## 2015-08-22 DIAGNOSIS — O4410 Placenta previa with hemorrhage, unspecified trimester: Secondary | ICD-10-CM | POA: Insufficient documentation

## 2015-08-22 DIAGNOSIS — O99213 Obesity complicating pregnancy, third trimester: Secondary | ICD-10-CM | POA: Insufficient documentation

## 2015-08-22 MED ORDER — BETAMETHASONE SOD PHOS & ACET 6 (3-3) MG/ML IJ SUSP
12.0000 mg | Freq: Once | INTRAMUSCULAR | Status: AC
  Administered 2015-08-22: 12 mg via INTRAMUSCULAR
  Filled 2015-08-22: qty 2

## 2015-08-22 NOTE — Progress Notes (Signed)
Subjective:  Susan Owens is a 24 y.o. G1P0 at 2412w1d being seen today for ongoing prenatal care.  She is currently monitored for the following issues for this low-risk pregnancy: Patient Active Problem List   Diagnosis Date Noted  . Obesity affecting pregnancy in second trimester 05/04/2015  . Encounter for supervision of normal first pregnancy 04/06/2015   Patient reports no complaints.  Contractions: Not present. Vag. Bleeding: None.  Movement: Present. Denies leaking of fluid.   The following portions of the patient's history were reviewed and updated as appropriate: allergies, current medications, past family history, past medical history, past social history, past surgical history and problem list. Problem list updated.  Objective:   Filed Vitals:   08/22/15 1346  BP: 108/71  Pulse: 96  Weight: 197 lb (89.359 kg)    Fetal Status: Fetal Heart Rate (bpm): 128   Movement: Present     General:  Alert, oriented and cooperative. Patient is in no acute distress.  Skin: Skin is warm and dry. No rash noted.   Cardiovascular: Normal heart rate noted  Respiratory: Normal respiratory effort, no problems with respiration noted  Abdomen: Soft, gravid, appropriate for gestational age. Pain/Pressure: Absent     Pelvic: Vag. Bleeding: None Vag D/C Character: Thin   Cervical exam deferred        Extremities: Normal range of motion.  Edema: None  Mental Status: Normal mood and affect. Normal behavior. Normal judgment and thought content.   Urinalysis: Urine Protein: Negative Urine Glucose: Negative  Assessment and Plan:  Pregnancy: G1P0 at 3012w1d  1. Encounter for supervision of normal first pregnancy in third trimester - She will have another TVS next week with MFM to see if it is still a funic presentation. If so, she will need a LTCS at 36-37 weeks. She is getting BMZ.  2. Obesity affecting pregnancy in second trimester   Preterm labor symptoms and general obstetric  precautions including but not limited to vaginal bleeding, contractions, leaking of fluid and fetal movement were reviewed in detail with the patient. Please refer to After Visit Summary for other counseling recommendations.  Return in about 2 weeks (around 09/05/2015).   Allie BossierMyra C Ebbie Cherry, MD

## 2015-08-23 ENCOUNTER — Ambulatory Visit (HOSPITAL_COMMUNITY)
Admission: RE | Admit: 2015-08-23 | Discharge: 2015-08-23 | Disposition: A | Discharge status: Home / Self Care | Payer: Self-pay | Source: Ambulatory Visit | Attending: Obstetrics & Gynecology | Admitting: Obstetrics & Gynecology

## 2015-08-23 DIAGNOSIS — Z3A32 32 weeks gestation of pregnancy: Secondary | ICD-10-CM | POA: Insufficient documentation

## 2015-08-23 MED ORDER — BETAMETHASONE SOD PHOS & ACET 6 (3-3) MG/ML IJ SUSP
12.0000 mg | Freq: Once | INTRAMUSCULAR | Status: AC
  Administered 2015-08-23: 12 mg via INTRAMUSCULAR
  Filled 2015-08-23: qty 2

## 2015-08-29 ENCOUNTER — Ambulatory Visit (HOSPITAL_COMMUNITY)
Admission: RE | Admit: 2015-08-29 | Discharge: 2015-08-29 | Disposition: A | Discharge status: Home / Self Care | Payer: Self-pay | Source: Ambulatory Visit | Attending: Obstetrics & Gynecology | Admitting: Obstetrics & Gynecology

## 2015-09-05 ENCOUNTER — Ambulatory Visit (INDEPENDENT_AMBULATORY_CARE_PROVIDER_SITE_OTHER): Payer: Self-pay | Admitting: Obstetrics & Gynecology

## 2015-09-05 VITALS — BP 118/72 | HR 84 | Wt 194.0 lb

## 2015-09-05 DIAGNOSIS — Z3A34 34 weeks gestation of pregnancy: Secondary | ICD-10-CM

## 2015-09-05 DIAGNOSIS — O328XX0 Maternal care for other malpresentation of fetus, not applicable or unspecified: Secondary | ICD-10-CM

## 2015-09-05 DIAGNOSIS — O9893 Unspecified maternal infectious and parasitic disease complicating the puerperium: Secondary | ICD-10-CM

## 2015-09-05 DIAGNOSIS — O0993 Supervision of high risk pregnancy, unspecified, third trimester: Secondary | ICD-10-CM

## 2015-09-05 NOTE — Progress Notes (Signed)
Subjective:  Susan Owens is a 24 y.o. G1P0 at 3055w1d being seen today for ongoing prenatal care.  She is currently monitored for the following issues for this high-risk pregnancy and has Supervision of high-risk pregnancy; Obesity in pregnancy, antepartum; and Funic presentation on her problem list.  Patient reports no complaints.  Contractions: Not present. Vag. Bleeding: None.  Movement: Present. Denies leaking of fluid.   The following portions of the patient's history were reviewed and updated as appropriate: allergies, current medications, past family history, past medical history, past social history, past surgical history and problem list. Problem list updated.  Objective:   Filed Vitals:   09/05/15 0915  BP: 118/72  Pulse: 84  Weight: 194 lb (87.998 kg)    Fetal Status: Fetal Heart Rate (bpm): 142 Fundal Height: 34 cm Movement: Present     General:  Alert, oriented and cooperative. Patient is in no acute distress.  Skin: Skin is warm and dry. No rash noted.   Cardiovascular: Normal heart rate noted  Respiratory: Normal respiratory effort, no problems with respiration noted  Abdomen: Soft, gravid, appropriate for gestational age. Pain/Pressure: Present     Pelvic: Vag. Bleeding: None Vag D/C Character: Thin   Cervical exam deferred        Extremities: Normal range of motion.  Edema: None  Mental Status: Normal mood and affect. Normal behavior. Normal judgment and thought content.   Urinalysis: Urine Protein: Trace Urine Glucose: Negative  Assessment and Plan:  Pregnancy: G1P0 at 3355w1d  1. Funic presentation Noted at 29 weeks, present at 33 weeks.  Will rescan at 36 weeks.   If still persistent, will need cesarean delivery at 36- 37 weeks. Avoid digital examinations.  2. Supervision of high-risk pregnancy, third trimester Preterm labor symptoms and general obstetric precautions including but not limited to vaginal bleeding, contractions, leaking of fluid and  fetal movement were reviewed in detail with the patient. Please refer to After Visit Summary for other counseling recommendations.  Return in about 2 weeks (around 09/19/2015) for OB Visit, Pelvic cultures.   Tereso NewcomerUgonna A Malvin Morrish, MD

## 2015-09-05 NOTE — Patient Instructions (Signed)
Return to clinic for any obstetric concerns or go to [facility] for evaluation  

## 2015-09-19 ENCOUNTER — Other Ambulatory Visit (HOSPITAL_COMMUNITY): Payer: Self-pay | Admitting: Maternal and Fetal Medicine

## 2015-09-19 ENCOUNTER — Ambulatory Visit (HOSPITAL_COMMUNITY)
Admission: RE | Admit: 2015-09-19 | Discharge: 2015-09-19 | Disposition: A | Discharge status: Home / Self Care | Payer: Self-pay | Source: Ambulatory Visit | Attending: Obstetrics & Gynecology | Admitting: Obstetrics & Gynecology

## 2015-09-19 ENCOUNTER — Encounter (HOSPITAL_COMMUNITY): Payer: Self-pay

## 2015-09-19 DIAGNOSIS — O99213 Obesity complicating pregnancy, third trimester: Secondary | ICD-10-CM

## 2015-09-19 DIAGNOSIS — Z3A36 36 weeks gestation of pregnancy: Secondary | ICD-10-CM | POA: Insufficient documentation

## 2015-09-20 ENCOUNTER — Ambulatory Visit (INDEPENDENT_AMBULATORY_CARE_PROVIDER_SITE_OTHER): Payer: Self-pay | Admitting: Obstetrics & Gynecology

## 2015-09-20 VITALS — BP 118/77 | HR 89 | Wt 194.0 lb

## 2015-09-20 DIAGNOSIS — Z36 Encounter for antenatal screening of mother: Secondary | ICD-10-CM

## 2015-09-20 DIAGNOSIS — O0993 Supervision of high risk pregnancy, unspecified, third trimester: Secondary | ICD-10-CM

## 2015-09-20 DIAGNOSIS — Z113 Encounter for screening for infections with a predominantly sexual mode of transmission: Secondary | ICD-10-CM

## 2015-09-20 NOTE — Progress Notes (Signed)
Subjective:  Susan Owens is a 24 y.o. SH G1P0 at 5042w2d being seen today for ongoing prenatal care.  She is currently monitored for the following issues for this high-risk pregnancy and has Supervision of high-risk pregnancy; Obesity in pregnancy, antepartum; and Funic presentation on her problem list.  Patient reports no complaints.  Contractions: Irregular. Vag. Bleeding: None.  Movement: Present. Denies leaking of fluid.   The following portions of the patient's history were reviewed and updated as appropriate: allergies, current medications, past family history, past medical history, past social history, past surgical history and problem list. Problem list updated.  Objective:   Filed Vitals:   09/20/15 0939  BP: 118/77  Pulse: 89  Weight: 194 lb (87.998 kg)    Fetal Status: Fetal Heart Rate (bpm): 134   Movement: Present     General:  Alert, oriented and cooperative. Patient is in no acute distress.  Skin: Skin is warm and dry. No rash noted.   Cardiovascular: Normal heart rate noted  Respiratory: Normal respiratory effort, no problems with respiration noted  Abdomen: Soft, gravid, appropriate for gestational age. Pain/Pressure: Present     Pelvic: Vag. Bleeding: None Vag D/C Character: Thin   Cervical exam deferred        Extremities: Normal range of motion.  Edema: None  Mental Status: Normal mood and affect. Normal behavior. Normal judgment and thought content.   Urinalysis: Urine Protein: Negative Urine Glucose: Negative  Assessment and Plan:  Pregnancy: G1P0 at 5042w2d - cervical cultures today - PLTCS scheduled for 09-24-15 per MFM  There are no diagnoses linked to this encounter. Preterm labor symptoms and general obstetric precautions including but not limited to vaginal bleeding, contractions, leaking of fluid and fetal movement were reviewed in detail with the patient. Please refer to After Visit Summary for other counseling recommendations.  No  Follow-up on file.   Allie BossierMyra C Merrik Puebla, MD

## 2015-09-21 ENCOUNTER — Encounter (HOSPITAL_COMMUNITY): Payer: Self-pay

## 2015-09-21 ENCOUNTER — Encounter (HOSPITAL_COMMUNITY)
Admission: RE | Admit: 2015-09-21 | Discharge: 2015-09-21 | Disposition: A | Discharge status: Home / Self Care | Payer: Self-pay | Source: Ambulatory Visit | Attending: Family Medicine | Admitting: Family Medicine

## 2015-09-21 DIAGNOSIS — O0993 Supervision of high risk pregnancy, unspecified, third trimester: Secondary | ICD-10-CM

## 2015-09-21 DIAGNOSIS — O99213 Obesity complicating pregnancy, third trimester: Secondary | ICD-10-CM

## 2015-09-21 DIAGNOSIS — Z01818 Encounter for other preprocedural examination: Secondary | ICD-10-CM | POA: Insufficient documentation

## 2015-09-21 HISTORY — DX: Gastro-esophageal reflux disease without esophagitis: K21.9

## 2015-09-21 LAB — TYPE AND SCREEN
ABO/RH(D): O POS
ANTIBODY SCREEN: NEGATIVE

## 2015-09-21 LAB — CBC
HEMATOCRIT: 36.5 % (ref 36.0–46.0)
Hemoglobin: 12.2 g/dL (ref 12.0–15.0)
MCH: 30.3 pg (ref 26.0–34.0)
MCHC: 33.4 g/dL (ref 30.0–36.0)
MCV: 90.8 fL (ref 78.0–100.0)
PLATELETS: 360 10*3/uL (ref 150–400)
RBC: 4.02 MIL/uL (ref 3.87–5.11)
RDW: 13.3 % (ref 11.5–15.5)
WBC: 8.3 10*3/uL (ref 4.0–10.5)

## 2015-09-21 LAB — URINE CYTOLOGY ANCILLARY ONLY
Chlamydia: NEGATIVE
NEISSERIA GONORRHEA: NEGATIVE

## 2015-09-21 LAB — ABO/RH: ABO/RH(D): O POS

## 2015-09-21 NOTE — Patient Instructions (Signed)
Your procedure is scheduled on: September 24, 2015    [facility] at: 7:30 am   Pick up the phone at the desk and dial [phone]   Call this number if you have problems the morning of surgery: [phone].  Remember: Do NOT eat food: after midnight on Saturday night  Do NOT drink clear liquids after: midnight on Saturday night  Take these medicines the morning of surgery with a SIP OF WATER:  None   Do NOT wear jewelry (body piercing), metal hair clips/bobby pins, or nail polish. Do NOT wear lotions, powders, or perfumes.  You may wear deoderant. Do NOT shave for 48 hours prior to surgery. Do NOT bring valuables to the hospital. Leave suitcase in car.  After surgery it may be brought to your room.  For patients admitted to the hospital, checkout time is 11:00 AM the day of discharge.

## 2015-09-22 ENCOUNTER — Other Ambulatory Visit: Payer: Self-pay | Admitting: Family Medicine

## 2015-09-22 LAB — RPR: RPR Ser Ql: NONREACTIVE

## 2015-09-22 LAB — CULTURE, BETA STREP (GROUP B ONLY)

## 2015-09-24 ENCOUNTER — Inpatient Hospital Stay (HOSPITAL_COMMUNITY): Payer: Medicaid Other | Admitting: Anesthesiology

## 2015-09-24 ENCOUNTER — Inpatient Hospital Stay (HOSPITAL_COMMUNITY): Admission: RE | Admit: 2015-09-24 | Payer: Self-pay | Source: Ambulatory Visit | Admitting: Family Medicine

## 2015-09-24 ENCOUNTER — Inpatient Hospital Stay (HOSPITAL_COMMUNITY)
Admit: 2015-09-24 | Discharge: 2015-09-26 | DRG: 766 | Disposition: A | Discharge status: Home / Self Care | Payer: Medicaid Other | Attending: Family Medicine | Admitting: Family Medicine

## 2015-09-24 ENCOUNTER — Encounter (HOSPITAL_COMMUNITY): Disposition: A | Discharge status: Home / Self Care | Payer: Self-pay | Attending: Family Medicine

## 2015-09-24 ENCOUNTER — Encounter (HOSPITAL_COMMUNITY): Payer: Self-pay

## 2015-09-24 DIAGNOSIS — Z3A36 36 weeks gestation of pregnancy: Secondary | ICD-10-CM

## 2015-09-24 DIAGNOSIS — E669 Obesity, unspecified: Secondary | ICD-10-CM | POA: Diagnosis present

## 2015-09-24 DIAGNOSIS — O328XX0 Maternal care for other malpresentation of fetus, not applicable or unspecified: Secondary | ICD-10-CM

## 2015-09-24 DIAGNOSIS — O329XX0 Maternal care for malpresentation of fetus, unspecified, not applicable or unspecified: Secondary | ICD-10-CM | POA: Diagnosis present

## 2015-09-24 DIAGNOSIS — Z6834 Body mass index (BMI) 34.0-34.9, adult: Secondary | ICD-10-CM | POA: Diagnosis not present

## 2015-09-24 DIAGNOSIS — O99214 Obesity complicating childbirth: Secondary | ICD-10-CM

## 2015-09-24 SURGERY — Surgical Case
Anesthesia: Spinal | Site: Abdomen

## 2015-09-24 MED ORDER — NALBUPHINE HCL 10 MG/ML IJ SOLN: 5.0000 mg | INTRAMUSCULAR | Status: DC | PRN

## 2015-09-24 MED ORDER — OXYCODONE-ACETAMINOPHEN 5-325 MG PO TABS
1.0000 | ORAL_TABLET | ORAL | Status: DC | PRN
  Administered 2015-09-26: 1 via ORAL
  Filled 2015-09-24: qty 1

## 2015-09-24 MED ORDER — MORPHINE SULFATE (PF) 0.5 MG/ML IJ SOLN
INTRAMUSCULAR | Status: AC
  Filled 2015-09-24: qty 10

## 2015-09-24 MED ORDER — SIMETHICONE 80 MG PO CHEW: 80.0000 mg | CHEWABLE_TABLET | ORAL | Status: DC | PRN

## 2015-09-24 MED ORDER — PRENATAL MULTIVITAMIN CH
1.0000 | ORAL_TABLET | Freq: Every day | ORAL | Status: DC
  Administered 2015-09-25 – 2015-09-26 (×2): 1 via ORAL
  Filled 2015-09-24 (×2): qty 1

## 2015-09-24 MED ORDER — MORPHINE SULFATE (PF) 0.5 MG/ML IJ SOLN
INTRAMUSCULAR | Status: DC | PRN
  Administered 2015-09-24: .2 mg via INTRATHECAL

## 2015-09-24 MED ORDER — SIMETHICONE 80 MG PO CHEW
80.0000 mg | CHEWABLE_TABLET | Freq: Three times a day (TID) | ORAL | Status: DC
  Administered 2015-09-24 – 2015-09-26 (×6): 80 mg via ORAL
  Filled 2015-09-24 (×6): qty 1

## 2015-09-24 MED ORDER — OXYCODONE-ACETAMINOPHEN 5-325 MG PO TABS: 2.0000 | ORAL_TABLET | ORAL | Status: DC | PRN

## 2015-09-24 MED ORDER — PHENYLEPHRINE 8 MG IN D5W 100 ML (0.08MG/ML) PREMIX OPTIME
INJECTION | INTRAVENOUS | Status: AC
  Filled 2015-09-24: qty 100

## 2015-09-24 MED ORDER — FENTANYL CITRATE (PF) 100 MCG/2ML IJ SOLN
INTRAMUSCULAR | Status: AC
  Filled 2015-09-24: qty 2

## 2015-09-24 MED ORDER — LANOLIN HYDROUS EX OINT: 1.0000 "application " | TOPICAL_OINTMENT | CUTANEOUS | Status: DC | PRN

## 2015-09-24 MED ORDER — ONDANSETRON HCL 4 MG/2ML IJ SOLN
INTRAMUSCULAR | Status: AC
  Filled 2015-09-24: qty 2

## 2015-09-24 MED ORDER — ONDANSETRON HCL 4 MG/2ML IJ SOLN: 4.0000 mg | Freq: Three times a day (TID) | INTRAMUSCULAR | Status: DC | PRN

## 2015-09-24 MED ORDER — TETANUS-DIPHTH-ACELL PERTUSSIS 5-2.5-18.5 LF-MCG/0.5 IM SUSP: 0.5000 mL | Freq: Once | INTRAMUSCULAR | Status: DC

## 2015-09-24 MED ORDER — DIPHENHYDRAMINE HCL 25 MG PO CAPS: 25.0000 mg | ORAL_CAPSULE | Freq: Four times a day (QID) | ORAL | Status: DC | PRN

## 2015-09-24 MED ORDER — LACTATED RINGERS IV SOLN
INTRAVENOUS | Status: DC
  Administered 2015-09-24 – 2015-09-25 (×2): via INTRAVENOUS

## 2015-09-24 MED ORDER — SODIUM CHLORIDE 0.9 % IJ SOLN: 3.0000 mL | INTRAMUSCULAR | Status: DC | PRN

## 2015-09-24 MED ORDER — SCOPOLAMINE 1 MG/3DAYS TD PT72
1.0000 | MEDICATED_PATCH | Freq: Once | TRANSDERMAL | Status: DC
  Filled 2015-09-24: qty 1

## 2015-09-24 MED ORDER — NALBUPHINE HCL 10 MG/ML IJ SOLN: 5.0000 mg | Freq: Once | INTRAMUSCULAR | Status: DC | PRN

## 2015-09-24 MED ORDER — ONDANSETRON HCL 4 MG/2ML IJ SOLN
INTRAMUSCULAR | Status: DC | PRN
  Administered 2015-09-24: 4 mg via INTRAVENOUS

## 2015-09-24 MED ORDER — FENTANYL CITRATE (PF) 100 MCG/2ML IJ SOLN
INTRAMUSCULAR | Status: DC | PRN
  Administered 2015-09-24: 10 ug via INTRATHECAL
  Administered 2015-09-24: 40 ug via INTRAVENOUS

## 2015-09-24 MED ORDER — FENTANYL CITRATE (PF) 100 MCG/2ML IJ SOLN: 25.0000 ug | INTRAMUSCULAR | Status: DC | PRN

## 2015-09-24 MED ORDER — DIPHENHYDRAMINE HCL 25 MG PO CAPS: 25.0000 mg | ORAL_CAPSULE | ORAL | Status: DC | PRN

## 2015-09-24 MED ORDER — DIPHENHYDRAMINE HCL 50 MG/ML IJ SOLN: 12.5000 mg | INTRAMUSCULAR | Status: DC | PRN

## 2015-09-24 MED ORDER — MEPERIDINE HCL 25 MG/ML IJ SOLN: 6.2500 mg | INTRAMUSCULAR | Status: DC | PRN

## 2015-09-24 MED ORDER — CITRIC ACID-SODIUM CITRATE 334-500 MG/5ML PO SOLN
ORAL | Status: AC
  Administered 2015-09-24: 30 mL
  Filled 2015-09-24: qty 15

## 2015-09-24 MED ORDER — SIMETHICONE 80 MG PO CHEW
80.0000 mg | CHEWABLE_TABLET | ORAL | Status: DC
  Administered 2015-09-25 (×2): 80 mg via ORAL
  Filled 2015-09-24 (×2): qty 1

## 2015-09-24 MED ORDER — PHENYLEPHRINE 8 MG IN D5W 100 ML (0.08MG/ML) PREMIX OPTIME
INJECTION | INTRAVENOUS | Status: DC | PRN
  Administered 2015-09-24: 60 ug/min via INTRAVENOUS

## 2015-09-24 MED ORDER — KETOROLAC TROMETHAMINE 30 MG/ML IJ SOLN
30.0000 mg | Freq: Four times a day (QID) | INTRAMUSCULAR | Status: AC | PRN
  Administered 2015-09-24: 30 mg via INTRAMUSCULAR

## 2015-09-24 MED ORDER — IBUPROFEN 600 MG PO TABS
600.0000 mg | ORAL_TABLET | Freq: Four times a day (QID) | ORAL | Status: DC
  Administered 2015-09-24 – 2015-09-26 (×8): 600 mg via ORAL
  Filled 2015-09-24 (×8): qty 1

## 2015-09-24 MED ORDER — SCOPOLAMINE 1 MG/3DAYS TD PT72
MEDICATED_PATCH | TRANSDERMAL | Status: DC | PRN
  Administered 2015-09-24: 1 via TRANSDERMAL

## 2015-09-24 MED ORDER — ACETAMINOPHEN 325 MG PO TABS: 650.0000 mg | ORAL_TABLET | ORAL | Status: DC | PRN

## 2015-09-24 MED ORDER — OXYTOCIN 10 UNIT/ML IJ SOLN
40.0000 [IU] | INTRAVENOUS | Status: DC | PRN
  Administered 2015-09-24: 40 [IU] via INTRAVENOUS

## 2015-09-24 MED ORDER — MENTHOL 3 MG MT LOZG: 1.0000 | LOZENGE | OROMUCOSAL | Status: DC | PRN

## 2015-09-24 MED ORDER — LACTATED RINGERS IV SOLN
INTRAVENOUS | Status: DC | PRN
  Administered 2015-09-24: 10:00:00 via INTRAVENOUS

## 2015-09-24 MED ORDER — OXYTOCIN 40 UNITS IN LACTATED RINGERS INFUSION - SIMPLE MED: 62.5000 mL/h | INTRAVENOUS | Status: AC

## 2015-09-24 MED ORDER — OXYTOCIN 10 UNIT/ML IJ SOLN
INTRAMUSCULAR | Status: AC
  Filled 2015-09-24: qty 4

## 2015-09-24 MED ORDER — CEFAZOLIN SODIUM-DEXTROSE 2-3 GM-% IV SOLR
2.0000 g | INTRAVENOUS | Status: AC
  Administered 2015-09-24: 2 g via INTRAVENOUS
  Filled 2015-09-24: qty 50

## 2015-09-24 MED ORDER — KETOROLAC TROMETHAMINE 30 MG/ML IJ SOLN: 30.0000 mg | Freq: Four times a day (QID) | INTRAMUSCULAR | Status: AC | PRN

## 2015-09-24 MED ORDER — NALOXONE HCL 0.4 MG/ML IJ SOLN: 0.4000 mg | INTRAMUSCULAR | Status: DC | PRN

## 2015-09-24 MED ORDER — WITCH HAZEL-GLYCERIN EX PADS: 1.0000 "application " | MEDICATED_PAD | CUTANEOUS | Status: DC | PRN

## 2015-09-24 MED ORDER — NALOXONE HCL 2 MG/2ML IJ SOSY
1.0000 ug/kg/h | PREFILLED_SYRINGE | INTRAVENOUS | Status: DC | PRN
  Filled 2015-09-24: qty 2

## 2015-09-24 MED ORDER — SCOPOLAMINE 1 MG/3DAYS TD PT72
MEDICATED_PATCH | TRANSDERMAL | Status: AC
  Filled 2015-09-24: qty 1

## 2015-09-24 MED ORDER — BUPIVACAINE IN DEXTROSE 0.75-8.25 % IT SOLN
INTRATHECAL | Status: DC | PRN
  Administered 2015-09-24: 1.6 mL via INTRATHECAL

## 2015-09-24 MED ORDER — KETOROLAC TROMETHAMINE 30 MG/ML IJ SOLN
INTRAMUSCULAR | Status: AC
  Administered 2015-09-24: 30 mg via INTRAMUSCULAR
  Filled 2015-09-24: qty 1

## 2015-09-24 MED ORDER — ZOLPIDEM TARTRATE 5 MG PO TABS: 5.0000 mg | ORAL_TABLET | Freq: Every evening | ORAL | Status: DC | PRN

## 2015-09-24 MED ORDER — SENNOSIDES-DOCUSATE SODIUM 8.6-50 MG PO TABS
2.0000 | ORAL_TABLET | ORAL | Status: DC
  Administered 2015-09-25 (×2): 2 via ORAL
  Filled 2015-09-24 (×2): qty 2

## 2015-09-24 MED ORDER — LACTATED RINGERS IV SOLN
125.0000 mL/h | INTRAVENOUS | Status: DC
  Administered 2015-09-24 (×2): via INTRAVENOUS

## 2015-09-24 MED ORDER — DIBUCAINE 1 % RE OINT: 1.0000 "application " | TOPICAL_OINTMENT | RECTAL | Status: DC | PRN

## 2015-09-24 SURGICAL SUPPLY — 37 items
BENZOIN TINCTURE PRP APPL 2/3 (GAUZE/BANDAGES/DRESSINGS) ×3 IMPLANT
CATH ROBINSON RED A/P 16FR (CATHETERS) IMPLANT
CLAMP CORD UMBIL (MISCELLANEOUS) IMPLANT
CLOSURE STERI STRIP 1/2 X4 (GAUZE/BANDAGES/DRESSINGS) ×3 IMPLANT
CLOTH BEACON ORANGE TIMEOUT ST (SAFETY) ×3 IMPLANT
DRAPE SHEET LG 3/4 BI-LAMINATE (DRAPES) IMPLANT
DRSG OPSITE POSTOP 4X10 (GAUZE/BANDAGES/DRESSINGS) ×3 IMPLANT
DURAPREP 26ML APPLICATOR (WOUND CARE) ×3 IMPLANT
ELECT REM PT RETURN 9FT ADLT (ELECTROSURGICAL) ×3
ELECTRODE REM PT RTRN 9FT ADLT (ELECTROSURGICAL) ×1 IMPLANT
EXTRACTOR VACUUM M CUP 4 TUBE (SUCTIONS) IMPLANT
EXTRACTOR VACUUM M CUP 4' TUBE (SUCTIONS)
GLOVE BIOGEL PI IND STRL 7.0 (GLOVE) ×1 IMPLANT
GLOVE BIOGEL PI IND STRL 7.5 (GLOVE) ×2 IMPLANT
GLOVE BIOGEL PI INDICATOR 7.0 (GLOVE) ×2
GLOVE BIOGEL PI INDICATOR 7.5 (GLOVE) ×4
GLOVE ECLIPSE 7.5 STRL STRAW (GLOVE) ×3 IMPLANT
GOWN STRL REUS W/TWL LRG LVL3 (GOWN DISPOSABLE) ×9 IMPLANT
KIT ABG SYR 3ML LUER SLIP (SYRINGE) IMPLANT
NEEDLE HYPO 25X5/8 SAFETYGLIDE (NEEDLE) IMPLANT
NS IRRIG 1000ML POUR BTL (IV SOLUTION) ×3 IMPLANT
PACK C SECTION WH (CUSTOM PROCEDURE TRAY) ×3 IMPLANT
PAD ABD 7.5X8 STRL (GAUZE/BANDAGES/DRESSINGS) ×6 IMPLANT
PAD OB MATERNITY 4.3X12.25 (PERSONAL CARE ITEMS) ×3 IMPLANT
PENCIL SMOKE EVAC W/HOLSTER (ELECTROSURGICAL) ×3 IMPLANT
RTRCTR C-SECT PINK 25CM LRG (MISCELLANEOUS) ×3 IMPLANT
SPONGE GAUZE 4X4 12PLY STER LF (GAUZE/BANDAGES/DRESSINGS) ×3 IMPLANT
STRIP CLOSURE SKIN 1/2X4 (GAUZE/BANDAGES/DRESSINGS) ×2 IMPLANT
SUT MNCRL 0 VIOLET CTX 36 (SUTURE) IMPLANT
SUT MONOCRYL 0 CTX 36 (SUTURE)
SUT VIC AB 0 CTX 36 (SUTURE) ×8
SUT VIC AB 0 CTX36XBRD ANBCTRL (SUTURE) ×4 IMPLANT
SUT VIC AB 2-0 CT1 27 (SUTURE) ×2
SUT VIC AB 2-0 CT1 TAPERPNT 27 (SUTURE) ×1 IMPLANT
SUT VIC AB 4-0 KS 27 (SUTURE) ×3 IMPLANT
TOWEL OR 17X24 6PK STRL BLUE (TOWEL DISPOSABLE) ×3 IMPLANT
TRAY FOLEY CATH SILVER 14FR (SET/KITS/TRAYS/PACK) ×3 IMPLANT

## 2015-09-24 NOTE — Anesthesia Preprocedure Evaluation (Signed)
Anesthesia Evaluation  Patient identified by MRN, date of birth, ID band Patient awake    Reviewed: Allergy & Precautions, NPO status , Patient's Chart, lab work & pertinent test results  History of Anesthesia Complications Negative for: history of anesthetic complications  Airway Mallampati: III  TM Distance: >3 FB Neck ROM: Full    Dental  (+) Teeth Intact,    Pulmonary neg pulmonary ROS,    breath sounds clear to auscultation       Cardiovascular negative cardio ROS   Rhythm:Regular     Neuro/Psych negative neurological ROS  negative psych ROS   GI/Hepatic Neg liver ROS, GERD  Medicated and Controlled,  Endo/Other  negative endocrine ROS  Renal/GU negative Renal ROS     Musculoskeletal   Abdominal   Peds  Hematology negative hematology ROS (+)   Anesthesia Other Findings   Reproductive/Obstetrics (+) Pregnancy                             Anesthesia Physical Anesthesia Plan  ASA: II  Anesthesia Plan: Spinal   Post-op Pain Management:    Induction:   Airway Management Planned: Natural Airway and Nasal Cannula  Additional Equipment: None  Intra-op Plan:   Post-operative Plan:   Informed Consent: I have reviewed the patients History and Physical, chart, labs and discussed the procedure including the risks, benefits and alternatives for the proposed anesthesia with the patient or authorized representative who has indicated his/her understanding and acceptance.   Dental advisory given  Plan Discussed with: CRNA and Surgeon  Anesthesia Plan Comments:         Anesthesia Quick Evaluation

## 2015-09-24 NOTE — Op Note (Signed)
Cesarean Section Operative Report  Susan Owens  09/24/2015  Indications: funic presentation  Pre-operative Diagnosis:  High Risk pregnancy with pre-natal contraction, obesity and Funic presentation.   Post-operative Diagnosis: Same   Surgeon: Surgeon(s) and Role:    * Levie HeritageJacob J Stinson, DO - Primary    * Kathrynn RunningNoah Bedford Alvera Tourigny, MD - Assisting   Attending Attestation: I was present and scrubbed for the entire procedure.   Anesthesia: spinal    Estimated Blood Loss: 700 ml  Total IV Fluids: 1900 ml LR  Urine Output:: 150 ml clear yellow urine  Specimens: none  Findings: Viable female infant in cephalic presentation; Apgars 16/1010/10; weight 2920 g; arterial cord pH not obtained; clear amniotic fluid; intact placenta with three vessel cord; normal uterus, fallopian tubes and ovaries bilaterally.  Baby condition / location:  Couplet care / Skin to Skin   Complications: no complications  Indications: Susan Owens is a 24 y.o. G1P0101 with an IUP 3549w6d presenting for scheduled c/s for persistent funic presentation.  The risks, benefits, complications, treatment options, and expected outcomes were discussed with the patient . The patient concurred with the proposed plan, giving informed consent. identified as Susan EhrichElizabeth Hernandez Owens and the procedure verified as C-Section Delivery.  Procedure Details:  The patient was taken back to the operative suite where spinal anesthesia was placed.  A time out was held and the above information confirmed.   After induction of anesthesia, the patient was draped and prepped in the usual sterile manner and placed in a dorsal supine position with a leftward tilt. A Pfannenstiel incision was made and carried down through the subcutaneous tissue to the fascia. Fascial incision was made and sharply extended transversely. The fascia was separated from the underlying rectus tissue superiorly and inferiorly. The peritoneum was  identified and bluntly entered and extended longitudinally. Alexis retractor was placed. A low transverse uterine incision was made and extended bluntly and then sharply in an upward direction at both edges with bandage scissors. Delivered from cephalic presentation was a viable infant with Apgars and weight as above. The umbilical cord was clamped and cut cord blood was obtained for evaluation. Cord ph was not sent. The placenta was removed Intact and appeared normal with velamentous insertion. The uterine outline, tubes and ovaries appeared normal}. The uterine incision was closed with running locked sutures of 0Vicryl with an imbricating layer of the same.   Hemostasis was observed after placement of 3 figure of 8 0 vicryl sutures. The peritoneum was closed with 0 vicryl. The rectus muscles were examined and hemostasis observed. The fascia was then reapproximated with running sutures of 0Vicryl. No subcuticular closure. The skin was closed with 4-0Vicryl.   Instrument, sponge, and needle counts were correct prior the abdominal closure and were correct at the conclusion of the case.     Disposition: PACU - hemodynamically stable.   Maternal Condition: stable       Signed: Lavonne Chickoah B WoukMD 09/24/2015 9:57 AM

## 2015-09-24 NOTE — Lactation Note (Signed)
This note was copied from the chart of Susan Owens. Lactation Consultation Note Initial visit at 7 hours of age.  Baby has had a bottle feeding of formula 70ms and breast feeding for 12 minutes.  Baby is 328w6dnd 6#7oz at delivery.  Green coForensic scientistn LPT infants given and discussed.  Encouraged mom to pump, she has kit in room. But prefers not to begin now.  Mom has few visitors in the room and does not seem very receptive to teaching.  Baby is already >6 hours so LC discussed importance of feeding plan for baby.  ENcouraged mom to wake baby for feedings every 2 1/2-3 hours as neeeded and to allow STS feedings.  Discussed spoon feeding as option vs bottle nipple when supplementing.  [facility] resources given and discussed.  Encouraged to feed with early cues on demand.  Early newborn behavior discussed.    Report given to RN including need to set up DEBP when mom is ready.  Mom to call for assist as needed.    Patient Name: Susan ElJerene Yeageroday's Date: 09/24/2015 Reason for consult: Initial assessment;Late preterm infant   Maternal Data Does the patient have breastfeeding experience prior to this delivery?: No  Feeding Feeding Type: Breast Fed Nipple Type: Slow - flow Length of feed: 12 min  LATCH Score/Interventions                      Lactation Tools Discussed/Used WIC Program: No Date initiated:: 09/24/15   Consult Status Consult Status: Follow-up Date: 09/25/15 Follow-up type: In-patient    Susan Owens, [name]2/25/2016, 4:45 PM

## 2015-09-24 NOTE — Transfer of Care (Signed)
Immediate Anesthesia Transfer of Care Note  Patient: Susan Owens  Procedure(s) Performed: Procedure(s): CESAREAN SECTION (N/A)  Patient Location: PACU  Anesthesia Type:Spinal  Level of Consciousness: awake, alert , oriented and patient cooperative  Airway & Oxygen Therapy: Patient Spontanous Breathing  Post-op Assessment: Report given to RN and Post -op Vital signs reviewed and stable  Post vital signs: Reviewed and stable  Last Vitals:  Filed Vitals:   09/24/15 0820  BP: 122/67  Pulse: 90  Resp: 18    Complications: No apparent anesthesia complications

## 2015-09-24 NOTE — H&P (Signed)
LABOR AND DELIVERY ADMISSION HISTORY AND PHYSICAL NOTE  Susan Owens is a 24 y.o. female G1P0 with IUP at [redacted]w[redacted]d by L/12 presenting for scheduled c/s for funic presentation. Is NPO.   She reports positive fetal movement. She denies leakage of fluid or vaginal bleeding.  Prenatal History/Complications:  Past Medical History: Past Medical History  Diagnosis Date  . GERD (gastroesophageal reflux disease)     with pregnancy     Past Surgical History: No past surgical history on file.  Obstetrical History: OB History    Gravida Para Term Preterm AB TAB SAB Ectopic Multiple Living   1               Social History: Social History   Social History  . Marital Status: Single    Spouse Name: N/A  . Number of Children: N/A  . Years of Education: N/A   Social History Main Topics  . Smoking status: Never Smoker   . Smokeless tobacco: Never Used  . Alcohol Use: No  . Drug Use: No  . Sexual Activity: Yes    Birth Control/ Protection: None   Other Topics Concern  . None   Social History Narrative    Family History: History reviewed. No pertinent family history.  Allergies: Allergies  Allergen Reactions  . Penicillins Diarrhea    Has patient had a PCN reaction causing immediate rash, facial/tongue/throat swelling, SOB or lightheadedness with hypotension: No Has patient had a PCN reaction causing severe rash involving mucus membranes or skin necrosis: No Has patient had a PCN reaction that required hospitalization No Has patient had a PCN reaction occurring within the last 10 years: Yes If all of the above answers are "NO", then may proceed with Cephalosporin use.     Prescriptions prior to admission  Medication Sig Dispense Refill Last Dose  . acetaminophen (TYLENOL) 500 MG tablet Take 500 mg by mouth every 6 (six) hours as needed for headache.     . calcium carbonate (TUMS - DOSED IN MG ELEMENTAL CALCIUM) 500 MG chewable tablet Chew 1 tablet by mouth daily  as needed for indigestion or heartburn.     . Prenatal Vit-Fe Fumarate-FA (PRENATAL MULTIVITAMIN) TABS tablet Take 1 tablet by mouth daily at 12 noon.        Review of Systems   All systems reviewed and negative except as stated in HPI  Blood pressure 122/67, pulse 90, resp. rate 18, last menstrual period 01/09/2015. General appearance: alert, cooperative and appears stated age Lungs: clear to auscultation bilaterally Heart: regular rate and rhythm Abdomen: soft, non-tender; bowel sounds normal Extremities: No calf swelling or tenderness Presentation: cephalic     Prenatal labs: ABO, Rh: --/--/O POS, O POS (12/22 1200) Antibody: NEG (12/22 1200) Rubella: !Error!immune RPR: Non Reactive (12/22 1200)  HBsAg: NEGATIVE (07/07 1100)  HIV: NONREACTIVE (10/25 1012)  GBS:   neg 1 hr Glucola: 86 Genetic screening:  declined Anatomy US: wnl  Prenatal Transfer Tool  Maternal Diabetes: No Genetic Screening: Declined Maternal Ultrasounds/Referrals: asymetric growth, umbilical vein varix, high normal s/d ratio Fetal Ultrasounds or other Referrals:  None Maternal Substance Abuse:  No Significant Maternal Medications:  None Significant Maternal Lab Results: Lab values include: Group B Strep negative  No results found for this or any previous visit (from the past 24 hour(s)).  Patient Active Problem List   Diagnosis Date Noted  . Funic presentation 09/05/2015  . Obesity in pregnancy, antepartum 05/04/2015  . Supervision of high-risk pregnancy 04/06/2015  Assessment: Susan Owens is a 24 y.o. G1P0 at 7123w6d here for scheduled c/s for funic presentation. Also with umbilical vein varix and asymetric growth noted on most recent u/s.  #Labor: admit for c/s, 2 g ancef (hx pcn allergy but does not appear to be type 1), h/h and plts wnl #Pain: spinal #ID:  gbs neg #MOF: breast/bottle #MOC: undecided; we had larc discussion #Circ:  n/a  Silvano Bilisoah B Adrian Specht 09/24/2015, 8:36  AM

## 2015-09-24 NOTE — Anesthesia Procedure Notes (Signed)
Spinal Patient location during procedure: OB Staffing Anesthesiologist: Manases Etchison Preanesthetic Checklist Completed: patient identified, surgical consent, pre-op evaluation, timeout performed, IV checked, risks and benefits discussed and monitors and equipment checked Spinal Block Patient position: sitting Prep: site prepped and draped and DuraPrep Patient monitoring: heart rate, cardiac monitor, continuous pulse ox and blood pressure Approach: midline Location: L3-4 Injection technique: single-shot Needle Needle type: Pencan  Needle gauge: 24 G Needle length: 10 cm Assessment Sensory level: T4   

## 2015-09-24 NOTE — Anesthesia Postprocedure Evaluation (Signed)
Anesthesia Post Note  Patient: Susan Owens  Procedure(s) Performed: Procedure(s) (LRB): CESAREAN SECTION (N/A)  Patient location during evaluation: PACU Anesthesia Type: Spinal Level of consciousness: awake Pain management: pain level controlled Vital Signs Assessment: post-procedure vital signs reviewed and stable Cardiovascular status: stable Postop Assessment: spinal receding Anesthetic complications: no    Last Vitals:  Filed Vitals:   09/24/15 1050 09/24/15 1100  BP:  118/70  Pulse: 91 84  Resp: 19 16    Last Pain:  Filed Vitals:   09/24/15 1118  PainSc: 0-No pain                 Marirose Deveney

## 2015-09-25 ENCOUNTER — Encounter (HOSPITAL_COMMUNITY): Payer: Self-pay | Admitting: *Deleted

## 2015-09-25 LAB — CBC
HEMATOCRIT: 30.7 % — AB (ref 36.0–46.0)
HEMOGLOBIN: 10.3 g/dL — AB (ref 12.0–15.0)
MCH: 30.7 pg (ref 26.0–34.0)
MCHC: 33.6 g/dL (ref 30.0–36.0)
MCV: 91.4 fL (ref 78.0–100.0)
Platelets: 309 10*3/uL (ref 150–400)
RBC: 3.36 MIL/uL — AB (ref 3.87–5.11)
RDW: 13.3 % (ref 11.5–15.5)
WBC: 9.7 10*3/uL (ref 4.0–10.5)

## 2015-09-25 NOTE — Addendum Note (Signed)
Addendum  created 09/25/15 0800 by Collier FlowersElizabeth J Loretta Kluender, CRNA   Modules edited: Clinical Notes   Clinical Notes:  File: [id number]

## 2015-09-25 NOTE — Progress Notes (Signed)
Subjective: Postpartum Day 1: Cesarean Delivery for funic presentation.  Patient reports tolerating PO, + flatus and no problems voiding.  Breast and bottle feeding.  Still uncertain regarding birth control method.    Objective: Vital signs in last 24 hours: Temp:  [97.3 F (36.3 C)-99.1 F (37.3 C)] 98.3 F (36.8 C) (12/26 0600) Pulse Rate:  [71-98] 71 (12/26 0600) Resp:  [11-21] 20 (12/26 0600) BP: (103-138)/(47-81) 113/56 mmHg (12/26 0600) SpO2:  [97 %-100 %] 98 % (12/26 0600) Weight:  [87.998 kg (194 lb)] 87.998 kg (194 lb) (12/25 [id number])  Physical Exam:  General: alert, cooperative and appears stated age CVS:  RRR, without murmur, gallops, or rubs Lungs:  CTA bilat ABD:  +BSx4, normal Lochia: appropriate Uterine Fundus: firm Incision: no significant drainage, dressing clean, dry, and intact DVT Evaluation: No evidence of DVT seen on physical exam.  SCDs on. Negative Homan's sign.  No results for input(s): HGB, HCT in the last 72 hours.  Assessment/Plan: Status post Cesarean section. Doing well postoperatively.  Continue current care.  Rochele PagesKARIM, WALIDAH N 09/25/2015, 6:44 AM

## 2015-09-25 NOTE — Lactation Note (Signed)
This note was copied from the chart of Susan Owens. Lactation Consultation Note  Patient Name: Susan Owens ZOXWR'UToday's Date: 09/25/2015 Reason for consult: Follow-up assessment Baby at 28 hr of life and mom only wants to bf and pump when she does not have visitors. She has not been supplementing the baby because she would to prefer to just use her milk. Went over LPT infant feeding guidelines. Mom stated understanding. She had 3 ml of her milk from a pumping she did about an hour ago. Demonstrated how to finger feed with her milk plus 7 ml of Similac Advanced. Discussed formula options with mom and she chose Similac Advanced. Mom will offer the breast on demand 8+/24hr, pump after feedings, and offer supplement in accordance with the guidelines given. She will ask visitors to step out if she feels uncomfortable or have a staff member ask for her if she feels like she can not. She will ask for help with bf or pumping as needed. She is aware of OP services and support group. Encouraged mom to make an OP apt before D/C.      Maternal Data Has patient been taught Hand Expression?: Yes  Feeding Feeding Type: Breast Milk with Formula added Length of feed: 7 min  LATCH Score/Interventions Latch: Repeated attempts needed to sustain latch, nipple held in mouth throughout feeding, stimulation needed to elicit sucking reflex. Intervention(s): Skin to skin;Waking techniques;Teach feeding cues Intervention(s): Adjust position;Assist with latch;Breast compression  Audible Swallowing: Spontaneous and intermittent Intervention(s): Skin to skin;Hand expression Intervention(s): Skin to skin;Hand expression  Type of Nipple: Everted at rest and after stimulation Intervention(s): No intervention needed  Comfort (Breast/Nipple): Soft / non-tender     Hold (Positioning): Assistance needed to correctly position infant at breast and maintain latch. Intervention(s):  Breastfeeding basics reviewed;Support Pillows;Position options;Skin to skin  LATCH Score: 8  Lactation Tools Discussed/Used     Consult Status Consult Status: Follow-up Date: 09/26/15 Follow-up type: In-patient    Susan Owens 09/25/2015, 1:59 PM

## 2015-09-25 NOTE — Anesthesia Postprocedure Evaluation (Signed)
Anesthesia Post Note  Patient: Susan Owens  Procedure(s) Performed: Procedure(s) (LRB): CESAREAN SECTION (N/A)  Patient location during evaluation: Mother Baby Anesthesia Type: Spinal Level of consciousness: awake and alert, oriented and patient cooperative Pain management: pain level controlled Vital Signs Assessment: post-procedure vital signs reviewed and stable Respiratory status: spontaneous breathing, nonlabored ventilation and respiratory function stable Cardiovascular status: stable Postop Assessment: no headache, no backache, patient able to bend at knees, no signs of nausea or vomiting and adequate PO intake Anesthetic complications: no    Last Vitals:  Filed Vitals:   09/25/15 0200 09/25/15 0600  BP: 103/52 113/56  Pulse: 83 71  Temp: 37.1 C 36.8 C  Resp: 20 20    Last Pain:  Filed Vitals:   09/25/15 0753  PainSc: Asleep                 Desiray Orchard,Lazette

## 2015-09-26 MED ORDER — OXYCODONE-ACETAMINOPHEN 5-325 MG PO TABS: 1.0000 | ORAL_TABLET | ORAL | Status: DC | PRN

## 2015-09-26 MED ORDER — IBUPROFEN 600 MG PO TABS: 600.0000 mg | ORAL_TABLET | Freq: Four times a day (QID) | ORAL | Status: DC

## 2015-09-26 NOTE — Discharge Summary (Signed)
OB Discharge Summary  Patient Name: Susan Owens DOB: 26-Feb-1991 MRN: [id number]  Date of admission: 09/24/2015 Delivering MD: Shonna ChockWOUK, NOAH BEDFORD   Date of discharge: 09/26/2015  Admitting diagnosis:  High Risk pregnancy with pre-natal contraction, obesity and Funic presentation Intrauterine pregnancy: 8431w6d     Secondary diagnosis:Active Problems:   Funic presentation  Additional problems:none     Discharge diagnosis: Term Pregnancy Delivered                                                                     Post partum procedures:n/a  Augmentation: n/a  Complications: None  Hospital course:  Onset of Labor With Unplanned C/S  24 y.o. yo G1P0101 at 6531w6d was admitted in Active Labor on 09/24/2015. Patient had a labor course significant for funic presentation. Membrane Rupture Time/Date: 9:27 AM ,09/24/2015   The patient went for cesarean section due to Malpresentation, and delivered a Viable infant,09/24/2015  Details of operation can be found in separate operative note. Patient had an uncomplicated postpartum course.  She is ambulating,tolerating a regular diet, passing flatus, and urinating well.  Patient is discharged home in stable condition No discharge date for patient encounter.Marland Kitchen.   Physical exam  Filed Vitals:   09/25/15 0600 09/25/15 0928 09/25/15 2200 09/26/15 0623  BP: 113/56 120/58 155/55 109/72  Pulse: 71 78 80 82  Temp: 98.3 F (36.8 C) 97.9 F (36.6 C) 98.1 F (36.7 C) 98.7 F (37.1 C)  TempSrc: Oral Oral Oral Oral  Resp: 20 18 18 14   Height:      Weight:      SpO2: 98% 98%  98%   General: alert, cooperative and no distress Lochia: appropriate Uterine Fundus: firm Incision: Healing well with no significant drainage, No significant erythema, Dressing is clean, dry, and intact DVT Evaluation: No evidence of DVT seen on physical exam. Negative Homan's sign. No cords or calf tenderness. Labs: Lab Results  Component Value Date    WBC 9.7 09/25/2015   HGB 10.3* 09/25/2015   HCT 30.7* 09/25/2015   MCV 91.4 09/25/2015   PLT 309 09/25/2015   No flowsheet data found.  Discharge instruction: per After Visit Summary and "Baby and Me Booklet".  After Visit Meds:    Medication List    ASK your doctor about these medications        calcium carbonate 500 MG chewable tablet  Commonly known as:  TUMS - dosed in mg elemental calcium  Chew 1 tablet by mouth daily as needed for indigestion or heartburn.     prenatal multivitamin Tabs tablet  Take 1 tablet by mouth daily at 12 noon.        Diet: routine diet  Activity: Advance as tolerated. Pelvic rest for 6 weeks.   Outpatient follow up:6 weeks Follow up Appt:Future Appointments Date Time Provider Department Center  10/09/2015 1:45 PM Reva Boresanya S Pratt, MD [facility]  11/06/2015 1:15 PM Tereso NewcomerUgonna A Anyanwu, MD [facility]   Follow up visit: No Follow-up on file.  Postpartum contraception: Undecided  Newborn Data: Live born female  Birth Weight: 6 lb 7 oz (2920 g) APGAR: 10, 10  Baby Feeding: Breast Disposition:home with mother   09/26/2015 Ferdie PingLAWSON, Saloni Lablanc DARLENE, CNM

## 2015-09-26 NOTE — Lactation Note (Addendum)
This note was copied from the chart of Susan Owens. Lactation Consultation Note  Patient Name: Susan Owens FAOZH'YToday's Date: 09/26/2015 Reason for consult: Follow-up assessment    With this mom of a LPI, now 37 1/[redacted] weeks gestation, but just under 6 pounds and at 7% weight loss last night, now 51 hours old. The baby latches well, but mom has mod to severe pain with latch.   On exam, the baby has a heart shaped tongue and a poster tight, short frenulum, this. I spoke to Dr. Claudia DesanctisHartzell. and she agreed to clip this frenulum, prior to the baby being discharged to hoe today.  Mom has been pumping, [facility], for 12 days, due back 10/08/15. I faxed WIC mom's information, for her to apply and get a DEP if still needed at that time. Mom knows to call for questions/concerns - I will see if mom wants to make an o/p consult appoinment prior to discharge. O/p lactation appointment made for 1/4 at 4 pm.    Maternal Data    Feeding Feeding Type: Breast Fed  LATCH Score/Interventions Latch: Grasps breast easily, tongue down, lips flanged, rhythmical sucking. Intervention(s): Skin to skin;Teach feeding cues;Waking techniques Intervention(s): Adjust position;Assist with latch  Audible Swallowing: A few with stimulation Intervention(s): Hand expression  Type of Nipple: Everted at rest and after stimulation (soft nipples, short) Intervention(s): Hand pump;Double electric pump  Comfort (Breast/Nipple): Filling, red/small blisters or bruises, mild/mod discomfort  Problem noted: Mild/Moderate discomfort  Hold (Positioning): Assistance needed to correctly position infant at breast and maintain latch. Intervention(s): Breastfeeding basics reviewed;Support Pillows;Position options;Skin to skin  LATCH Score: 7  Lactation Tools Discussed/Used WIC Program: No (fax sent for mom to apply and get DEP if needed) Pump Review: Setup, frequency, and  cleaning;Milk Storage   Consult Status Consult Status: Complete Follow-up type: Call as needed    Alfred LevinsLee, Jullien Granquist Anne 09/26/2015, 12:40 PM

## 2015-10-04 ENCOUNTER — Ambulatory Visit (HOSPITAL_COMMUNITY)
Admission: RE | Admit: 2015-10-04 | Discharge: 2015-10-04 | Disposition: A | Discharge status: Home / Self Care | Payer: Self-pay | Source: Ambulatory Visit | Attending: Family Medicine | Admitting: Family Medicine

## 2015-10-04 ENCOUNTER — Ambulatory Visit (HOSPITAL_COMMUNITY): Payer: Self-pay

## 2015-10-04 NOTE — Lactation Note (Signed)
Lactation Consult for Susan Owens (mother) and Susan Owens (DOB: [birth year])  Mother's reason for visit: f/u from hospital stay Consult:  Initial Lactation Consultant:  Remigio Eisenmengerichey, Aryiah Monterosso Hamilton  ________________________________________________________________________ BW: 2920g (6# 7oz) 09-28-15: 6# 3 oz Today's weight: 6# 8.3oz ________________________________________________________________________  Mother's Name: Susan Owens Type of delivery:  C/S (funic presentation) Breastfeeding Experience: primip Maternal Medical Conditions:  None Maternal Medications: PNV, Percocet, IB, Calcium  ________________________________________________________________________  Breastfeeding History (Post Discharge)  Frequency of breastfeeding: 3h Duration of feeding: 5-10 min   Pumping  Type of pump:  Symphony Frequency: q4hrs Volume: 300 ml  Infant Intake and Output Assessment  Voids: 6-7 in 24 hrs.  Color:  Clear yellow Stools: 5-6 in 24 hrs.  Color:  Yellow  ________________________________________________________________________  Maternal Breast Assessment  Breast:  Full Nipple:  Erect  _______________________________________________________________________ Feeding Assessment/Evaluation  Initial feeding assessment:  Infant's oral assessment:  WNL (s/p frenotomy by Dr. Ronalee RedHartsell while in-hospital) Good elevation noted, Mom w/o complaints of nipple soreness.   Attached assessment:  Deep  Lips flanged:  Yes.     Suck assessment:  Nutritive  Pre-feed weight: 2958g   Post-feed weight: 2998 g  Amount transferred: 40 ml L breast, cross-cradle, 7 min  Total amount transferred: 40 ml    Born at 6855w6d, Gordy Councilmanlexandra is 2010 days old & 1+oz above BW. She was observed latching to the breast w/ease & she transferred 40mL in about 7 minutes. She was then satiated. Alexandra did cough/choke at the breast immediately prior to the end of the feeding, which  resolved w/Alexandra unlatching herself.  Mom was shown how to do laid-back nursing & how to suppress some of the superficial milk ducts so as to decrease the likelihood of Alexandra choking at the breast. Mom is also aware that she can pump prior to the feeding if her breasts are overly full (Mom pumps 10 oz q4h). Mom given BFSG resources if she would like to return to have baby weighed, etc. I have no concerns.   Glenetta HewKim Deryk Bozman, RN, IBCLC

## 2015-10-09 ENCOUNTER — Encounter: Payer: Self-pay | Admitting: Family Medicine

## 2015-10-13 ENCOUNTER — Encounter: Payer: Self-pay | Admitting: Family Medicine

## 2015-10-16 ENCOUNTER — Ambulatory Visit (INDEPENDENT_AMBULATORY_CARE_PROVIDER_SITE_OTHER): Payer: Self-pay | Admitting: Obstetrics and Gynecology

## 2015-10-16 ENCOUNTER — Encounter: Payer: Self-pay | Admitting: Obstetrics and Gynecology

## 2015-10-16 VITALS — BP 105/61 | HR 64 | Ht 63.0 in | Wt 181.0 lb

## 2015-10-16 DIAGNOSIS — Z9889 Other specified postprocedural states: Secondary | ICD-10-CM

## 2015-10-16 NOTE — Progress Notes (Signed)
Patient ID: Susan Owens, female   DOB: December 17, 1990, 25 y.o.   MRN: [id number]  25 yo G1P1 s/p cesarean section 3 weeks ago here for wound check. Patient reports doing well at home. She is no longer using any narcotics for pain. She is breastfeeding and is receiving ample support from family members. She is not sexually active.   Past Medical History  Diagnosis Date  . GERD (gastroesophageal reflux disease)     with pregnancy    Past Surgical History  Procedure Laterality Date  . Cesarean section N/A 09/24/2015    Procedure: CESAREAN SECTION;  Surgeon: Levie HeritageJacob J Stinson, DO;  Location: [facility];  Service: Obstetrics;  Laterality: N/A;   History reviewed. No pertinent family history. Social History  Substance Use Topics  . Smoking status: Never Smoker   . Smokeless tobacco: Never Used  . Alcohol Use: No   ROS See pertinent in HPI  Blood pressure 105/61, pulse 64, height 5\' 3"  (1.6 m), weight 181 lb (82.101 kg), currently breastfeeding. GENERAL: Well-developed, well-nourished female in no acute distress.  ABDOMEN: Soft, nontender, nondistended. No organomegaly. INCISION: healing well. No erythema, induration or drainage EXTREMITIES: No cyanosis, clubbing, or edema, 2+ distal pulses.  A/P 25 yo here for wound check s/p c-section 3 weeks ago - Incision healing well - Patient interested in Nexplanon for contraception - RTC for postpartum visit

## 2015-11-06 ENCOUNTER — Ambulatory Visit: Payer: Self-pay | Admitting: Obstetrics & Gynecology

## 2017-01-15 IMAGING — US US MFM OB FOLLOW-UP
1 series · 3 of 3 positions shown · non-contrast
Comparison: none

[Series 1: us mfm ob follow-up · 0.26mm/px · 3 of 3 slices shown]
[im 1/3]
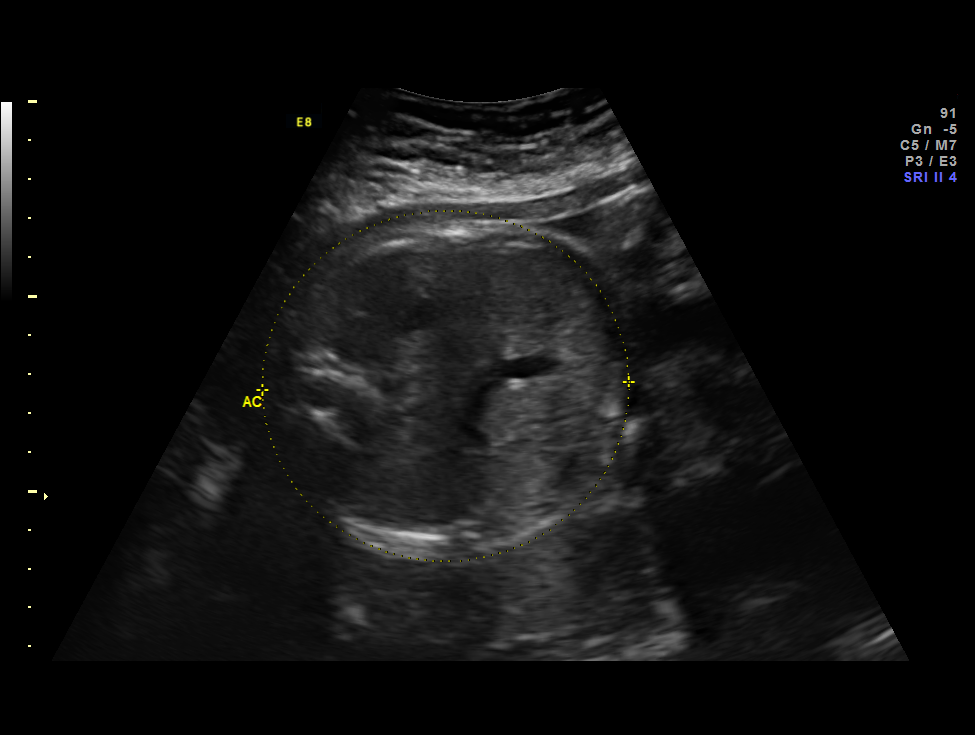
[im 2/3]
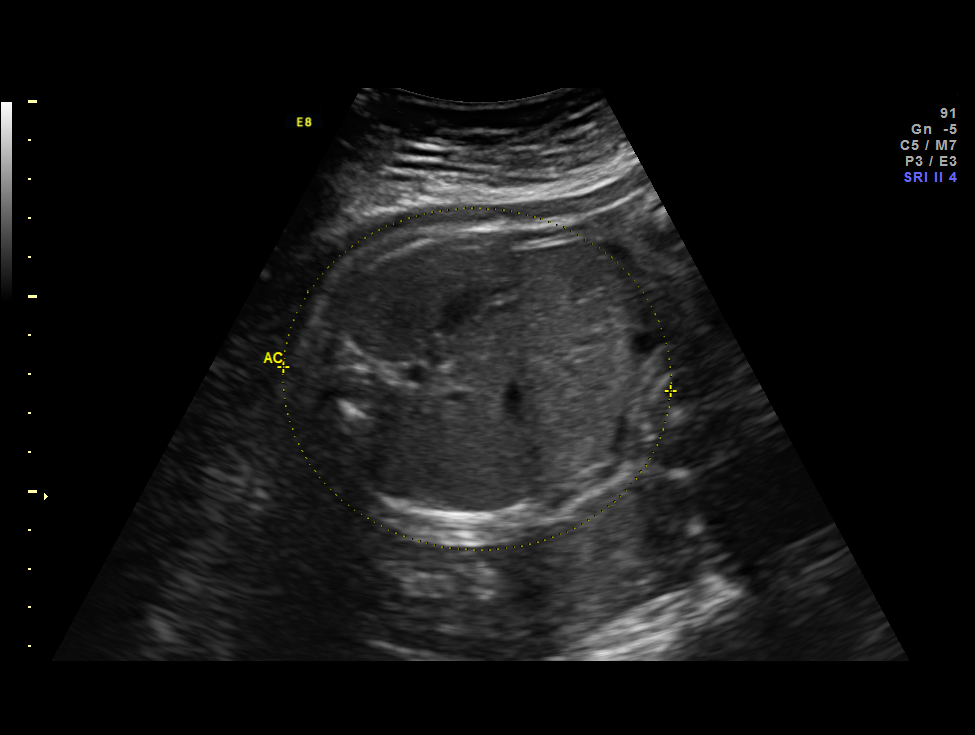
[im 3/3]
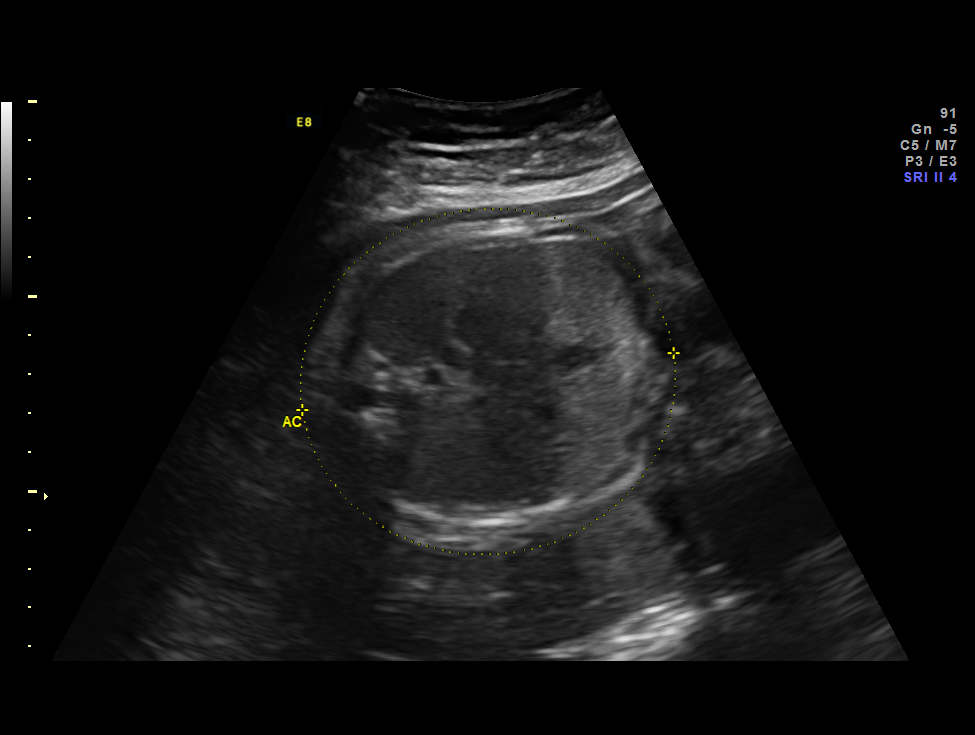

[3 of 3 positions shown; findings below may reference images not displayed]

OBSTETRICS REPORT
(Signed Final 08/22/2015 [DATE])

Name:       DANIEL RODRIGUES BEU                   Visit  08/22/2015 [DATE]
ADRINA                                Date:

By:
Service(s) Provided

US MFM OB TRANSVAGINAL                                 76817.2
Indications

32 weeks gestation of pregnancy
Obesity complicating pregnancy, third trimester
Vasa previa
Low lying placenta
Fetal Evaluation

Num Of             1
Fetuses:
Fetal Heart        131                          bpm
Rate:
Cardiac Activity:  Observed
Presentation:      Cephalic
Placenta:          Posterior, above cervical
os
P. Cord            Velamentous insertion
Insertion:

Amniotic Fluid
AFI FV:      Subjectively within normal limits
AFI Sum:     10.28    cm      18  %Tile     Larg Pckt:      3.9  cm
RUQ:   3.9     cm    LUQ:   3.84    cm   LLQ:    2.54    cm
Biometry

BPD:     75.5   m    G. Age:   30w 2d                 CI:        71.08   70 - 86
m
FL/HC:      23.0   19.1 -
21.3
HC:     285.3   m    G. Age:   31w 2d         6  %    HC/AC:      0.97   0.96 -
m
AC:     294.6   m    G. Age:   33w 3d        83  %    FL/BPD      87.0   71 - 87
m                                     :
FL:      65.7   m    G. Age:   33w 6d        81  %    FL/AC:      22.3   20 - 24
m
HUM:     58.8   m    G. Age:   34w 0d        91  %
m

Est.        5515   gm   4 lb 10 oz      74   %
FW:
Gestational Age

LMP:           32w 1d        Date:  01/09/15                  EDD:   10/16/15
U/S Today:     32w 2d                                         EDD:   10/15/15
Best:          32w 1d    Det. By:   LMP  (01/09/15)           EDD:   10/16/15
Anatomy

Cranium:          Appears normal         Aortic Arch:       Appears normal
Fetal Cavum:      Previously seen        Ductal Arch:       Previously seen
Ventricles:       Appears normal         Diaphragm:         Appears normal
Choroid Plexus:   Previously seen        Stomach:           Appears normal,
left sided
Cerebellum:       Previously seen        Abdomen:           Appears normal
Posterior         Previously seen        Abdominal          Previously seen
Fossa:                                   Wall:
Nuchal Fold:      Previously seen        Cord Vessels:      Appears normal (3
vessel cord)
Face:             Orbits and profile     Kidneys:           Appear normal
previously seen
Lips:             Appears normal         Bladder:           Appears normal
Palate:           Previously seen        Spine:             Previously seen
Heart:            Appears normal         Lower              Previously seen
(4CH, axis, and        Extremities:
situs)
RVOT:             Previously seen        Upper              Previously seen
Extremities:
LVOT:             Previously seen

Other:   Fetus appears to be a female. Heels previously seen.
Cervix Uterus Adnexa

Cervical Length:    3.1       cm

Cervix:       Normal appearance by transvaginal scan

Left Ovary:    Not visualized. No adnexal mass visualized.
Right Ovary:   Within normal limits.
Impression

Single IUP at 32w 1d
Normal interval anatomy
Fetal growth is appropriate (74th %tile)
A posterior placenta is noted.
Normal amniotic fluid volume

TVUS - the placenta cord insertion is located  just over the
internal os (marginal vs. velametous cord insertion).  The is
clearly a funic presentation.  I am unable to visualize any
"unprotected" fetal vessels that course along fetal
membranes.
Recommendations

Will give a course of Hach[place] today and tomorrow.
Transvaginal ultrasound next week to reevaluate.  If the
funic presentation persists, recommend delivery via C-
section at 36-37 weeks gestation.

WILLIAMS QU with us.  Please do not hesitate to

## 2019-05-17 ENCOUNTER — Other Ambulatory Visit: Payer: Self-pay

## 2019-05-17 DIAGNOSIS — Z20822 Contact with and (suspected) exposure to covid-19: Secondary | ICD-10-CM

## 2019-05-19 LAB — NOVEL CORONAVIRUS, NAA: SARS-CoV-2, NAA: NOT DETECTED

## 2020-05-11 ENCOUNTER — Encounter: Payer: Self-pay | Admitting: Obstetrics and Gynecology

## 2020-05-16 ENCOUNTER — Other Ambulatory Visit (HOSPITAL_COMMUNITY)
Admission: RE | Admit: 2020-05-16 | Discharge: 2020-05-16 | Disposition: A | Discharge status: Home / Self Care | Payer: Self-pay | Source: Ambulatory Visit | Attending: Obstetrics and Gynecology | Admitting: Obstetrics and Gynecology

## 2020-05-16 ENCOUNTER — Other Ambulatory Visit: Payer: Self-pay

## 2020-05-16 ENCOUNTER — Ambulatory Visit (INDEPENDENT_AMBULATORY_CARE_PROVIDER_SITE_OTHER): Payer: Self-pay | Admitting: Obstetrics and Gynecology

## 2020-05-16 ENCOUNTER — Encounter: Payer: Self-pay | Admitting: Obstetrics and Gynecology

## 2020-05-16 VITALS — BP 121/77 | HR 76 | Wt 190.2 lb

## 2020-05-16 DIAGNOSIS — Z3401 Encounter for supervision of normal first pregnancy, first trimester: Secondary | ICD-10-CM

## 2020-05-16 DIAGNOSIS — Z3480 Encounter for supervision of other normal pregnancy, unspecified trimester: Secondary | ICD-10-CM | POA: Insufficient documentation

## 2020-05-16 DIAGNOSIS — Z98891 History of uterine scar from previous surgery: Secondary | ICD-10-CM | POA: Insufficient documentation

## 2020-05-16 DIAGNOSIS — E669 Obesity, unspecified: Secondary | ICD-10-CM

## 2020-05-16 DIAGNOSIS — O99210 Obesity complicating pregnancy, unspecified trimester: Secondary | ICD-10-CM

## 2020-05-16 NOTE — Progress Notes (Addendum)
New OB Note  05/16/2020   Clinic: [facility]  Chief Complaint: New OB  Transfer of Care Patient: no  History of Present Illness: [name] is a 29 y.o. G2P0101 @ 11/2 weeks (EDC 3/6, based on Patient's last menstrual period was 02/27/2020.=11wk u/s today).  Preg complicated by has Obesity in pregnancy, antepartum; Encounter for supervision of normal first pregnancy in first trimester; and History of cesarean delivery on their problem list.   Any events prior to today's visit: no Her periods were: qmonth, regular She was using no method when she conceived.  She has Negative signs or symptoms of miscarriage or preterm labor  ROS: A 12-point review of systems was performed and negative, except as stated in the above HPI.  OBGYN History: As per HPI. OB History  Gravida Para Term Preterm AB Living  2 1   1   1   SAB TAB Ectopic Multiple Live Births        0 1    # Outcome Date GA Lbr Len/2nd Weight Sex Delivery Anes PTL Lv  2 Current           1 Preterm 09/24/15 [redacted]w[redacted]d  6 lb 7 oz (2.92 kg) F CS-LVertical Spinal  LIV     Birth Comments: normal exam    Any issues with any prior pregnancies: previa, funic presentation Prior children are healthy, doing well, and without any problems or issues: yes History of pap smears: Yes. Last pap smear 2016 and results were negative   Past Medical History: Past Medical History:  Diagnosis Date  . GERD (gastroesophageal reflux disease)    with pregnancy     Past Surgical History: Past Surgical History:  Procedure Laterality Date  . CESAREAN SECTION N/A 09/24/2015   Procedure: CESAREAN SECTION;  Surgeon: 09/26/2015, DO;  Location: [facility];  Service: Obstetrics;  Laterality: N/A;    Family History:  History reviewed. No pertinent family history.  Social History:  Social History   Socioeconomic History  . Marital status: Single    Spouse name: Not on file  . Number of children: Not on file   . Years of education: Not on file  . Highest education level: Not on file  Occupational History  . Not on file  Tobacco Use  . Smoking status: Never Smoker  . Smokeless tobacco: Never Used  Substance and Sexual Activity  . Alcohol use: No  . Drug use: No  . Sexual activity: Not Currently    Birth control/protection: None  Other Topics Concern  . Not on file  Social History Narrative  . Not on file   Social Determinants of Health   Financial Resource Strain:   . Difficulty of Paying Living Expenses:   Food Insecurity:   . Worried About Levie Heritage in the Last Year:   . Programme researcher, broadcasting/film/video in the Last Year:   Transportation Needs:   . Barista (Medical):   Freight forwarder Lack of Transportation (Non-Medical):   Physical Activity:   . Days of Exercise per Week:   . Minutes of Exercise per Session:   Stress:   . Feeling of Stress :   Social Connections:   . Frequency of Communication with Friends and Family:   . Frequency of Social Gatherings with Friends and Family:   . Attends Religious Services:   . Active Member of Clubs or Organizations:   . Attends Marland Kitchen Meetings:   Banker Marital  Status:   Intimate Partner Violence:   . Fear of Current or Ex-Partner:   . Emotionally Abused:   Marland Kitchen Physically Abused:   . Sexually Abused:    Allergy: Allergies  Allergen Reactions  . Penicillins Diarrhea    Has patient had a PCN reaction causing immediate rash, facial/tongue/throat swelling, SOB or lightheadedness with hypotension: No Has patient had a PCN reaction causing severe rash involving mucus membranes or skin necrosis: No Has patient had a PCN reaction that required hospitalization No Has patient had a PCN reaction occurring within the last 10 years: Yes If all of the above answers are "NO", then may proceed with Cephalosporin use.     Current Outpatient Medications: PN vitamin Physical Exam:   BP 121/77   Pulse 76   Wt 190 lb 3.2 oz (86.3 kg)    LMP 02/27/2020   BMI 33.69 kg/m  Body mass index is 33.69 kg/m.   Vag. Bleeding: None. Fundal height: not applicable FHTs: 150s  General appearance: Well nourished, well developed female in no acute distress.  Cardiovascular: S1, S2 normal, no murmur, rub or gallop, regular rate and rhythm Respiratory:  Clear to auscultation bilateral. Normal respiratory effort Abdomen: positive bowel sounds and no masses, hernias; diffusely non tender to palpation, non distended Breasts: no breast s/s Neuro/Psych:  Normal mood and affect.  Skin:  Warm and dry.  Lymphatic:  No inguinal lymphadenopathy.   Pelvic exam: is not limited by body habitus EGBUS: within normal limits, Vagina: within normal limits and with no blood in the vault, Cervix: normal appearing cervix without discharge or lesions, closed/long/high, Uterus:  enlarged, c/w 10-12 week size, and Adnexa:  normal adnexa and no mass, fullness, tenderness  Laboratory: none  Imaging:  Bedside u/s with sliup, 11/5 weeks, normal FHR  Assessment: pt doing well  Plan: 1. Encounter for supervision of normal first pregnancy in first trimester Routine care. Pt is adopt a mom. Schedule [place] u/s next visit. Offer afp nv. Genetic screening information given - Cytology - PAP( [place]) - Culture, OB Urine - CBC/D/Plt+RPR+Rh+ABO+Rub Ab... - Cervicovaginal ancillary only( [place]) - Hemoglobin A1c - Babyscripts Schedule Optimization - Comprehensive metabolic panel - Protein / creatinine ratio, urine - TSH - VITAMIN D 25 Hydroxy (Vit-D Deficiency, Fractures)  2. History of cesarean 2016 pLTCS due to funic presentation. Okay for tolac. Pt unsure and d/w that will talk to her more about this later in pregnancy.   Problem list reviewed and updated.  Follow up in 4 weeks.  >50% of 30 min visit spent on counseling and coordination of care.     Cornelia Copa MD Attending Center for [facility])

## 2020-05-16 NOTE — Progress Notes (Signed)
DATING AND VIABILITY SONOGRAM   Sharae Zappulla is a 29 y.o. year old G2P0101 with LMP Patient's last menstrual period was 02/27/2020. which would correlate to  [redacted]w[redacted]d weeks gestation.  She has regular menstrual cycles.   She is here today for a confirmatory initial sonogram.    GESTATION: SINGLETON yes     FETAL ACTIVITY:          Heart rate      158          The fetus is active.   GESTATIONAL AGE AND  BIOMETRICS:  Gestational criteria: Estimated Date of Delivery: 12/03/20 by LMP now at [redacted]w[redacted]d  Previous Scans:0  GESTATIONAL SAC            mm          weeks  CROWN RUMP LENGTH           5.07 mm         11.5 weeks                                                   AVERAGE EGA(BY THIS SCAN):  11.5 weeks  WORKING EDD( LMP ):  12/03/2020     TECHNICIAN COMMENTS:  Patient informed that the ultrasound is considered a limited obstetric ultrasound and is not intended to be a complete ultrasound exam. Patient also informed that the ultrasound is not being completed with the intent of assessing for fetal or placental anomalies or any pelvic abnormalities. Explained that the purpose of today's ultrasound is to assess for fetal heart rate. Patient acknowledges the purpose of the exam and the limitations of the study.       A copy of this report including all images has been saved and backed up to a second source for retrieval if needed. All measures and details of the anatomical scan, placentation, fluid volume and pelvic anatomy are contained in that report.  Scheryl Marten 05/16/2020 2:12 PM

## 2020-05-16 NOTE — Patient Instructions (Addendum)
March 6 is your due date Today, you are 11 weeks and 2 days

## 2020-05-17 LAB — PROTEIN / CREATININE RATIO, URINE
Creatinine, Urine: 93.3 mg/dL
Protein, Ur: 5.5 mg/dL
Protein/Creat Ratio: 59 mg/g creat (ref 0–200)

## 2020-05-17 LAB — COMPREHENSIVE METABOLIC PANEL
ALT: 13 IU/L (ref 0–32)
AST: 16 IU/L (ref 0–40)
Albumin/Globulin Ratio: 1.8 (ref 1.2–2.2)
Albumin: 4.6 g/dL (ref 3.9–5.0)
Alkaline Phosphatase: 47 IU/L — ABNORMAL LOW (ref 48–121)
BUN/Creatinine Ratio: 12 (ref 9–23)
BUN: 6 mg/dL (ref 6–20)
Bilirubin Total: <0.2 mg/dL (ref 0.0–1.2)
CO2: 23 mmol/L (ref 20–29)
Calcium: 9.5 mg/dL (ref 8.7–10.2)
Chloride: 100 mmol/L (ref 96–106)
Creatinine, Ser: 0.5 mg/dL — ABNORMAL LOW (ref 0.57–1.00)
GFR calc Af Amer: 153 mL/min/{1.73_m2} (ref >59)
GFR calc non Af Amer: 133 mL/min/{1.73_m2} (ref >59)
Globulin, Total: 2.5 g/dL (ref 1.5–4.5)
Glucose: 72 mg/dL (ref 65–99)
Potassium: 3.7 mmol/L (ref 3.5–5.2)
Sodium: 134 mmol/L (ref 134–144)
Total Protein: 7.1 g/dL (ref 6.0–8.5)

## 2020-05-17 LAB — CBC/D/PLT+RPR+RH+ABO+RUB AB...
Antibody Screen: NEGATIVE
Basophils Absolute: 0 10*3/uL (ref 0.0–0.2)
Basos: 0 %
EOS (ABSOLUTE): 0.1 10*3/uL (ref 0.0–0.4)
Eos: 1 %
HCV Ab: <0.1 s/co ratio (ref 0.0–0.9)
HIV Screen 4th Generation wRfx: NONREACTIVE
Hematocrit: 40.8 % (ref 34.0–46.6)
Hemoglobin: 13.7 g/dL (ref 11.1–15.9)
Hepatitis B Surface Ag: NEGATIVE
Immature Grans (Abs): 0.1 10*3/uL (ref 0.0–0.1)
Immature Granulocytes: 1 %
Lymphocytes Absolute: 2.1 10*3/uL (ref 0.7–3.1)
Lymphs: 21 %
MCH: 30.7 pg (ref 26.6–33.0)
MCHC: 33.6 g/dL (ref 31.5–35.7)
MCV: 92 fL (ref 79–97)
Monocytes Absolute: 0.5 10*3/uL (ref 0.1–0.9)
Monocytes: 5 %
Neutrophils Absolute: 7.3 10*3/uL — ABNORMAL HIGH (ref 1.4–7.0)
Neutrophils: 72 %
Platelets: 452 10*3/uL — ABNORMAL HIGH (ref 150–450)
RBC: 4.46 x10E6/uL (ref 3.77–5.28)
RDW: 11.9 % (ref 11.7–15.4)
RPR Ser Ql: NONREACTIVE
Rh Factor: POSITIVE
Rubella Antibodies, IGG: 8.63 index (ref >0.99)
WBC: 10 10*3/uL (ref 3.4–10.8)

## 2020-05-17 LAB — CERVICOVAGINAL ANCILLARY ONLY
Bacterial Vaginitis (gardnerella): NEGATIVE
Chlamydia: NEGATIVE
Comment: NEGATIVE
Comment: NEGATIVE
Comment: NEGATIVE
Comment: NORMAL
Neisseria Gonorrhea: NEGATIVE
Trichomonas: NEGATIVE

## 2020-05-17 LAB — HCV INTERPRETATION

## 2020-05-17 LAB — TSH: TSH: 1.53 u[IU]/mL (ref 0.450–4.500)

## 2020-05-18 LAB — URINE CULTURE, OB REFLEX

## 2020-05-18 LAB — SPECIMEN STATUS REPORT

## 2020-05-18 LAB — CYTOLOGY - PAP
Comment: NEGATIVE
Diagnosis: NEGATIVE
High risk HPV: NEGATIVE

## 2020-05-18 LAB — CULTURE, OB URINE

## 2020-05-18 LAB — VITAMIN D 25 HYDROXY (VIT D DEFICIENCY, FRACTURES): Vit D, 25-Hydroxy: 17.9 ng/mL — ABNORMAL LOW (ref 30.0–100.0)

## 2020-05-19 ENCOUNTER — Encounter: Payer: Self-pay | Admitting: Obstetrics and Gynecology

## 2020-05-19 DIAGNOSIS — R7989 Other specified abnormal findings of blood chemistry: Secondary | ICD-10-CM | POA: Insufficient documentation

## 2020-05-19 MED ORDER — VITAMIN D (ERGOCALCIFEROL) 1.25 MG (50000 UNIT) PO CAPS: 50000.0000 [IU] | ORAL_CAPSULE | ORAL | 0 refills | Status: AC

## 2020-05-19 NOTE — Addendum Note (Signed)
Addended by: [place] Bing on: 05/19/2020 09:47 AM   Modules accepted: Orders

## 2020-06-13 ENCOUNTER — Other Ambulatory Visit: Payer: Self-pay

## 2020-06-13 ENCOUNTER — Ambulatory Visit (INDEPENDENT_AMBULATORY_CARE_PROVIDER_SITE_OTHER): Payer: Self-pay | Admitting: Obstetrics & Gynecology

## 2020-06-13 VITALS — BP 129/76 | HR 81 | Wt 191.6 lb

## 2020-06-13 DIAGNOSIS — Z3A15 15 weeks gestation of pregnancy: Secondary | ICD-10-CM

## 2020-06-13 DIAGNOSIS — Z98891 History of uterine scar from previous surgery: Secondary | ICD-10-CM

## 2020-06-13 DIAGNOSIS — Z3402 Encounter for supervision of normal first pregnancy, second trimester: Secondary | ICD-10-CM

## 2020-06-13 NOTE — Progress Notes (Signed)
   PRENATAL VISIT NOTE  Subjective:  Susan Owens is a 29 y.o. G2P0101 at [redacted]w[redacted]d being seen today for ongoing prenatal care.  She is currently monitored for the following issues for this low-risk pregnancy and has Obesity in pregnancy; Encounter for supervision of normal first pregnancy in second trimester; History of cesarean delivery; Obesity (BMI 30.0-34.9); and Low vitamin D level on their problem list.  Patient reports no complaints.   . Vag. Bleeding: None.   . Denies leaking of fluid.   The following portions of the patient's history were reviewed and updated as appropriate: allergies, current medications, past family history, past medical history, past social history, past surgical history and problem list.   Objective:   Vitals:   06/13/20 0920  BP: 129/76  Pulse: 81  Weight: 191 lb 9.6 oz (86.9 kg)    Fetal Status: Fetal Heart Rate (bpm): 151         General:  Alert, oriented and cooperative. Patient is in no acute distress.  Skin: Skin is warm and dry. No rash noted.   Cardiovascular: Normal heart rate noted  Respiratory: Normal respiratory effort, no problems with respiration noted  Abdomen: Soft, gravid, appropriate for gestational age.  Pain/Pressure: Absent     Pelvic: Cervical exam deferred        Extremities: Normal range of motion.     Mental Status: Normal mood and affect. Normal behavior. Normal judgment and thought content.   Assessment and Plan:  Pregnancy: G2P0101 at [redacted]w[redacted]d 1. History of cesarean delivery Desires TOLAC, information given to her. Will sign consent later.  2. [redacted] weeks gestation of pregnancy 3. Encounter for supervision of normal first pregnancy in second trimester Declines genetic testing, needs anatomy scan at [facility], will be scheduled. The patient was counseled on the potential benefits and lack of known risks of COVID vaccination, during pregnancy and breastfeeding, during today's visit. The patient's questions and concerns were  addressed today, including safety of the vaccination and potential side effects as they have been published by ACOG and SMFM. The patient has been informed that there have not been any documented vaccine related injuries, deaths or birth defects to infant or mom after receiving the COVID-19 vaccine to date. The patient has been made aware that although she is not at increased risk of contracting COVID-19 during pregnancy, she is at increased risk of developing severe disease and complications if she contracts COVID-19 while pregnant. All patient questions were addressed during our visit today. The patient is still unsure of her decision for vaccination.  N.o other complaints or concerns.  Routine obstetric precautions reviewed.   Please refer to After Visit Summary for other counseling recommendations.   Return in about 4 weeks (around 07/11/2020) for OFFICE OB Visit.  No future appointments.  Jaynie Collins, MD

## 2020-06-13 NOTE — Patient Instructions (Addendum)
Second Trimester of Pregnancy The second trimester is from week 14 through week 27 (months 4 through 6). The second trimester is often a time when you feel your best. Your body has adjusted to being pregnant, and you begin to feel better physically. Usually, morning sickness has lessened or quit completely, you may have more energy, and you may have an increase in appetite. The second trimester is also a time when the fetus is growing rapidly. At the end of the sixth month, the fetus is about 9 inches long and weighs about 1 pounds. You will likely begin to feel the baby move (quickening) between 16 and 20 weeks of pregnancy. Body changes during your second trimester Your body continues to go through many changes during your second trimester. The changes vary from woman to woman.  Your weight will continue to increase. You will notice your lower abdomen bulging out.  You may begin to get stretch marks on your hips, abdomen, and breasts.  You may develop headaches that can be relieved by medicines. The medicines should be approved by your health care provider.  You may urinate more often because the fetus is pressing on your bladder.  You may develop or continue to have heartburn as a result of your pregnancy.  You may develop constipation because certain hormones are causing the muscles that push waste through your intestines to slow down.  You may develop hemorrhoids or swollen, bulging veins (varicose veins).  You may have back pain. This is caused by: ? Weight gain. ? Pregnancy hormones that are relaxing the joints in your pelvis. ? A shift in weight and the muscles that support your balance.  Your breasts will continue to grow and they will continue to become tender.  Your gums may bleed and may be sensitive to brushing and flossing.  Dark spots or blotches (chloasma, mask of pregnancy) may develop on your face. This will likely fade after the baby is born.  A dark line from your  belly button to the pubic area (linea nigra) may appear. This will likely fade after the baby is born.  You may have changes in your hair. These can include thickening of your hair, rapid growth, and changes in texture. Some women also have hair loss during or after pregnancy, or hair that feels dry or thin. Your hair will most likely return to normal after your baby is born. What to expect at prenatal visits During a routine prenatal visit:  You will be weighed to make sure you and the fetus are growing normally.  Your blood pressure will be taken.  Your abdomen will be measured to track your baby's growth.  The fetal heartbeat will be listened to.  Any test results from the previous visit will be discussed. Your health care provider may ask you:  How you are feeling.  If you are feeling the baby move.  If you have had any abnormal symptoms, such as leaking fluid, bleeding, severe headaches, or abdominal cramping.  If you are using any tobacco products, including cigarettes, chewing tobacco, and electronic cigarettes.  If you have any questions. Other tests that may be performed during your second trimester include:  Blood tests that check for: ? Low iron levels (anemia). ? High blood sugar that affects pregnant women (gestational diabetes) between 24 and 28 weeks. ? Rh antibodies. This is to check for a protein on red blood cells (Rh factor).  Urine tests to check for infections, diabetes, or protein in the   urine.  An ultrasound to confirm the proper growth and development of the baby.  An amniocentesis to check for possible genetic problems.  Fetal screens for spina bifida and Down syndrome.  HIV (human immunodeficiency virus) testing. Routine prenatal testing includes screening for HIV, unless you choose not to have this test. Follow these instructions at home: Medicines  Follow your health care provider's instructions regarding medicine use. Specific medicines may be  either safe or unsafe to take during pregnancy.  Take a prenatal vitamin that contains at least 600 micrograms (mcg) of folic acid.  If you develop constipation, try taking a stool softener if your health care provider approves. Eating and drinking   Eat a balanced diet that includes fresh fruits and vegetables, whole grains, good sources of protein such as meat, eggs, or tofu, and low-fat dairy. Your health care provider will help you determine the amount of weight gain that is right for you.  Avoid raw meat and uncooked cheese. These carry germs that can cause birth defects in the baby.  If you have low calcium intake from food, talk to your health care provider about whether you should take a daily calcium supplement.  Limit foods that are high in fat and processed sugars, such as fried and sweet foods.  To prevent constipation: ? Drink enough fluid to keep your urine clear or pale yellow. ? Eat foods that are high in fiber, such as fresh fruits and vegetables, whole grains, and beans. Activity  Exercise only as directed by your health care provider. Most women can continue their usual exercise routine during pregnancy. Try to exercise for 30 minutes at least 5 days a week. Stop exercising if you experience uterine contractions.  Avoid heavy lifting, wear low heel shoes, and practice good posture.  A sexual relationship may be continued unless your health care provider directs you otherwise. Relieving pain and discomfort  Wear a good support bra to prevent discomfort from breast tenderness.  Take warm sitz baths to soothe any pain or discomfort caused by hemorrhoids. Use hemorrhoid cream if your health care provider approves.  Rest with your legs elevated if you have leg cramps or low back pain.  If you develop varicose veins, wear support hose. Elevate your feet for 15 minutes, 3-4 times a day. Limit salt in your diet. Prenatal Care  Write down your questions. Take them to  your prenatal visits.  Keep all your prenatal visits as told by your health care provider. This is important. Safety  Wear your seat belt at all times when driving.  Make a list of emergency phone numbers, including numbers for family, friends, the hospital, and police and fire departments. General instructions  Ask your health care provider for a referral to a local prenatal education class. Begin classes no later than the beginning of month 6 of your pregnancy.  Ask for help if you have counseling or nutritional needs during pregnancy. Your health care provider can offer advice or refer you to specialists for help with various needs.  Do not use hot tubs, steam rooms, or saunas.  Do not douche or use tampons or scented sanitary pads.  Do not cross your legs for long periods of time.  Avoid cat litter boxes and soil used by cats. These carry germs that can cause birth defects in the baby and possibly loss of the fetus by miscarriage or stillbirth.  Avoid all smoking, herbs, alcohol, and unprescribed drugs. Chemicals in these products can affect the formation   and growth of the baby.  Do not use any products that contain nicotine or tobacco, such as cigarettes and e-cigarettes. If you need help quitting, ask your health care provider.  Visit your dentist if you have not gone yet during your pregnancy. Use a soft toothbrush to brush your teeth and be gentle when you floss. Contact a health care provider if:  You have dizziness.  You have mild pelvic cramps, pelvic pressure, or nagging pain in the abdominal area.  You have persistent nausea, vomiting, or diarrhea.  You have a bad smelling vaginal discharge.  You have pain when you urinate. Get help right away if:  You have a fever.  You are leaking fluid from your vagina.  You have spotting or bleeding from your vagina.  You have severe abdominal cramping or pain.  You have rapid weight gain or weight loss.  You have  shortness of breath with chest pain.  You notice sudden or extreme swelling of your face, hands, ankles, feet, or legs.  You have not felt your baby move in over an hour.  You have severe headaches that do not go away when you take medicine.  You have vision changes. Summary  The second trimester is from week 14 through week 27 (months 4 through 6). It is also a time when the fetus is growing rapidly.  Your body goes through many changes during pregnancy. The changes vary from woman to woman.  Avoid all smoking, herbs, alcohol, and unprescribed drugs. These chemicals affect the formation and growth your baby.  Do not use any tobacco products, such as cigarettes, chewing tobacco, and e-cigarettes. If you need help quitting, ask your health care provider.  Contact your health care provider if you have any questions. Keep all prenatal visits as told by your health care provider. This is important. This information is not intended to replace advice given to you by your health care provider. Make sure you discuss any questions you have with your health care provider. Document Revised: 01/08/2019 Document Reviewed: 10/22/2016 Elsevier Patient Education  2020 [employer].   Vaginal Birth After Cesarean Delivery  Vaginal birth after cesarean delivery (VBAC) is giving birth vaginally after previously delivering a baby through a cesarean section (C-section). A VBAC may be a safe option for you, depending on your health and other factors. It is important to discuss VBAC with your health care provider early in your pregnancy so you can understand the risks, benefits, and options. Having these discussions early will give you time to make your birth plan. Who are the best candidates for VBAC? The best candidates for VBAC are women who:  Have had one or two prior cesarean deliveries, and the incision made during the delivery was horizontal (low transverse).  Do not have a vertical (classical)  scar on their uterus.  Have not had a tear in the wall of their uterus (uterine rupture).  Plan to have more pregnancies. A VBAC is also more likely to be successful:  In women who have previously given birth vaginally.  When labor starts by itself (spontaneously) before the due date. What are the benefits of VBAC? The benefits of delivering your baby vaginally instead of by a cesarean delivery include:  A shorter hospital stay.  A faster recovery time.  Less pain.  Avoiding risks associated with major surgery, such as infection and blood clots.  Less blood loss and less need for donated blood (transfusions). What are the risks of VBAC? The main  health care provider to deliver your baby by a C-section. Other risks are rare and include: Tearing (rupture) of the scar from a past cesarean delivery. Other risks associated with vaginal deliveries. If a repeat cesarean delivery is needed, the risks include: Blood loss. Infection. Blood clot. Damage to surrounding organs. Removal of the uterus (hysterectomy), if it is damaged. Placenta problems in future pregnancies. What else should I know about my options? Delivering a baby through a VBAC is similar to having a normal spontaneous vaginal delivery. Therefore, it is safe: To try with twins. For your health care provider to try to turn the baby from a breech position (external cephalic version) during labor. With epidural analgesia for pain relief. Consider where you would like to deliver your baby. VBAC should be attempted in facilities where an emergency cesarean delivery can be performed. VBAC is not recommended for home births. Any changes in your health or your baby's health during your pregnancy may make it necessary to change your initial decision about VBAC. Your health care provider may recommend that you do not attempt a VBAC if: Your baby's suspected weight is 8.8 lb (4 kg) or more. You  have preeclampsia. This is a condition that causes high blood pressure along with other symptoms, such as swelling and headaches. You will have VBAC less than 19 months after your cesarean delivery. You are past your due date. You need to have labor started (induced) because your cervix is not ready for labor (unfavorable). Where to find more information American Pregnancy Association: americanpregnancy.org American Congress of Obstetricians and Gynecologists: acog.org Summary Vaginal birth after cesarean delivery (VBAC) is giving birth vaginally after previously delivering a baby through a cesarean section (C-section). A VBAC may be a safe option for you, depending on your health and other factors. Discuss VBAC with your health care provider early in your pregnancy so you can understand the risks, benefits, options, and have plenty of time to make your birth plan. The main risk of attempting a VBAC is that it may fail, forcing your health care provider to deliver your baby by a C-section. Other risks are rare. This information is not intended to replace advice given to you by your health care provider. Make sure you discuss any questions you have with your health care provider. Document Revised: 01/12/2019 Document Reviewed: 12/24/2016 Elsevier Patient Education  2020 Elsevier Inc.  

## 2020-07-11 ENCOUNTER — Other Ambulatory Visit: Payer: Self-pay

## 2020-07-11 ENCOUNTER — Ambulatory Visit (INDEPENDENT_AMBULATORY_CARE_PROVIDER_SITE_OTHER): Payer: Self-pay | Admitting: Obstetrics and Gynecology

## 2020-07-11 VITALS — BP 129/80 | HR 84 | Wt 196.2 lb

## 2020-07-11 DIAGNOSIS — Z3A19 19 weeks gestation of pregnancy: Secondary | ICD-10-CM

## 2020-07-11 DIAGNOSIS — O99210 Obesity complicating pregnancy, unspecified trimester: Secondary | ICD-10-CM

## 2020-07-11 DIAGNOSIS — Z98891 History of uterine scar from previous surgery: Secondary | ICD-10-CM

## 2020-07-11 DIAGNOSIS — E669 Obesity, unspecified: Secondary | ICD-10-CM

## 2020-07-11 NOTE — Progress Notes (Signed)
Prenatal Visit Note Date: 07/11/2020 Clinic: [facility]  Subjective:  Susan Owens is a 29 y.o. G2P0101 at [redacted]w[redacted]d being seen today for ongoing prenatal care.  She is currently monitored for the following issues for this low-risk pregnancy and has Obesity in pregnancy; Supervision of other normal pregnancy, antepartum; History of cesarean delivery; Obesity (BMI 30.0-34.9); and Low vitamin D level on their problem list.  Patient reports no complaints.   Contractions: Not present. Vag. Bleeding: None.  Movement: Absent. Denies leaking of fluid.   The following portions of the patient's history were reviewed and updated as appropriate: allergies, current medications, past family history, past medical history, past social history, past surgical history and problem list. Problem list updated.  Objective:   Vitals:   07/11/20 0926  BP: 129/80  Pulse: 84  Weight: 196 lb 3.2 oz (89 kg)    Fetal Status: Fetal Heart Rate (bpm): 147   Movement: Absent     General:  Alert, oriented and cooperative. Patient is in no acute distress.  Skin: Skin is warm and dry. No rash noted.   Cardiovascular: Normal heart rate noted  Respiratory: Normal respiratory effort, no problems with respiration noted  Abdomen: Soft, gravid, appropriate for gestational age. Pain/Pressure: Absent     Pelvic:  Cervical exam deferred        Extremities: Normal range of motion.     Mental Status: Normal mood and affect. Normal behavior. Normal judgment and thought content.   Urinalysis:      Assessment and Plan:  Pregnancy: G2P0101 at [redacted]w[redacted]d  1. History of cesarean delivery D/w pt more in later visits  2. Pregnancy Has [place] anatomy u/s in two days.   Preterm labor symptoms and general obstetric precautions including but not limited to vaginal bleeding, contractions, leaking of fluid and fetal movement were reviewed in detail with the patient. Please refer to After Visit Summary  for other counseling recommendations.  Return in about 4 weeks (around 08/08/2020) for low risk, in person.   [place] Bing, MD

## 2020-08-09 ENCOUNTER — Other Ambulatory Visit: Payer: Self-pay

## 2020-08-09 ENCOUNTER — Ambulatory Visit (INDEPENDENT_AMBULATORY_CARE_PROVIDER_SITE_OTHER): Payer: Self-pay | Admitting: Advanced Practice Midwife

## 2020-08-09 VITALS — BP 131/78 | HR 87 | Wt 202.6 lb

## 2020-08-09 DIAGNOSIS — Z3482 Encounter for supervision of other normal pregnancy, second trimester: Secondary | ICD-10-CM

## 2020-08-09 DIAGNOSIS — Z3A23 23 weeks gestation of pregnancy: Secondary | ICD-10-CM

## 2020-08-09 DIAGNOSIS — Z98891 History of uterine scar from previous surgery: Secondary | ICD-10-CM

## 2020-08-09 NOTE — Patient Instructions (Addendum)
Teste de tolerncia  glicose durante a gravidez Glucose Tolerance Test During Pregnancy Por que estou fazendo este teste? O teste de tolerncia  glicose (TTG)  feito para verificar como seu corpo processa o acar (glicose). Esse  um dos vrios testes utilizados para diagnosticar o diabetes que se desenvolve durante a gravidez (diabetes mellitus gestacional). O diabetes gestacional  uma forma temporria de diabetes que algumas mulheres manifestam durante a gravidez. Geralmente ocorre durante o segundo trimestre da [place] e desaparece aps o parto. O teste (triagem) para diabetes gestacional geralmente ocorre entre as semanas 24 e 28 da [place]. Voc pode fazer o teste TTG depois de fazer um teste de glicose de 1 hora se os resultados desse teste indicarem que voc pode ter diabetes gestacional. Voc tambm pode fazer este teste se:  Tiver histrico de diabetes gestacional.  Tiver histrico de nascimento de bebs muito grandes ou tiver sofrido perda recorrente de fetos (parto de natimorto).  Apresentar sinais e sintomas de diabetes, tais como: ? Alteraes da viso. ? Formigamento ou dormncia nas mos ou ps. ? Alteraes na fome, sede e mico que no explicadas pela gravidez. O que  examinado? Esse teste mede a quantidade de glicose no sangue em momentos diferentes durante um perodo de 3 horas. Isso indica o quanto seu corpo consegue processar a glicose. Que tipo de [place]  coletada?  Amostras de sangue so necessrias para esse teste. Geralmente, so coletados [employer] insero de uma agulha em um vaso sanguneo. Como devo me preparar para esse teste?  Nos 3 dias anteriores ao exame, coma normalmente. Consuma bastante alimentos ricos em carboidratos.  Siga as instrues do seu mdico sobre: ? Restries de comidas ou bebidas no dia do exame. Voc pode ser orientado a no comer ou beber nada alm de gua (jejum) a partir de 8-10 horas antes do teste. ? Alterao ou  interrupo dos seus medicamentos regulares. Alguns medicamentos podem interferir nesse teste. Informe seu mdico sobre:  Todos os medicamentos que [employer], inclusive vitaminas, fitoterpicos, colrios, cremes e medicamentos de venda sem receita mdica.  Quaisquer doenas sanguneas.  Cirurgias anteriores.  Quaisquer quadros clnicos. O que acontece durante o exame? Primeiro, sua glicose sangunea ser [employer]. Isso  chamado de glicose sangunea de jejum, j que voc estava em jejum antes do exame. Depois, voc vai beber uma soluo de glicose que contm uma certa quantidade de [place]. Sua glicose sangunea ser medida novamente 1, 2 e 3 horas depois que voc beber a soluo. Esse teste leva cerca de 3 horas para ser concludo. Voc precisar permanecer no local do teste durante todo esse perodo. Durante o perodo do teste:  No coma ou beba nada que no seja a soluo de glicose.  No faa exerccio.  No use nenhum produto que contenha nicotina ou tabaco, como cigarros tradicionais e cigarros eletrnicos. Caso precise de ajuda para parar, fale com seu mdico. O procedimento do teste pode variar de acordo com o mdico e o hospital. [place] os resultados so relatados? Seus resultados sero relatados em miligramas de glicose por decilitro de sangue (mg/dl) ou milimoles por litro (mmol/l). Seu mdico ir comparar seus resultados com os intervalos normais que foram estabelecidos aps o teste de um grande grupo de pessoas (intervalos de referncia). Os intervalos de referncia podem variar de acordo com o laboratrio e o hospital. Para esse teste, os intervalos de referncia comuns so:  Jejum: menos de 95-105 mg/dl (2,9-9,2 mmol/l).  1 hora depois de beber a glicose: menos de 180-190  mg/dl (19,5-09,3 mmol/l).  2 horas depois de beber glicose: menos de 155-165 mg/dl (2,6-7,1 mmol/l).  3 horas depois de beber glicose: 140-145 mg/dl (2,4-5,8 mmol/l). O que os resultados significam? Os  resultados [place] intervalos de referncia so considerados normais, o que significa que os seus nveis de glicose esto bem controlados. Se duas ou mais medies de sua glicose sangunea estiverem altas, voc pode ser diagnosticada com diabetes gestacional. Se apenas uma medio estiver alta, seu mdico poder sugerir repetio do exame ou outros testes para confirmar o diagnstico. Converse com seu mdico sobre o que significam seus [place]. Perguntas a fazer ao seu mdico Pergunte ao mdico ou ao departamento que realizou o exame:  Quando meus resultados estaro prontos?  Como devo retirar [employer]?  Quais so as opes de tratamento?  Quais outros exames eu preciso fazer?  Quais so os prximos passos? Resumo  O teste de tolerncia  glicose (TTG)  um de diversos testes usados no diagnstico do diabetes durante a gravidez (diabetes mellitus gestacional). O diabetes gestacional  uma forma temporria de diabetes que algumas mulheres manifestam durante a gravidez.  Voc pode fazer o teste TTG depois de fazer um teste de glicose de 1 hora se os resultados desse teste indicarem que voc pode ter diabetes gestacional. Voc tambm pode fazer esse teste se tiver algum sintoma ou fator de risco para diabetes gestacional.  Converse com seu mdico sobre o que significam seus [place]. Estas informaes no se destinam a substituir as recomendaes de seu mdico. No deixe de discutir quaisquer dvidas com seu mdico. Document Revised: 07/15/2017 Document Reviewed: 07/15/2017 Elsevier Patient Education  2020 [employer].   [employer] [facility]: [address] [place]

## 2020-08-09 NOTE — Progress Notes (Signed)
   PRENATAL VISIT NOTE  Subjective:  Susan Owens is a 29 y.o. G2P0101 at [redacted]w[redacted]d being seen today for ongoing prenatal care.  She is currently monitored for the following issues for this low-risk pregnancy and has Obesity in pregnancy; Supervision of other normal pregnancy, antepartum; History of cesarean delivery; Obesity (BMI 30.0-34.9); and Low vitamin D level on their problem list.  Patient reports no complaints.  Contractions: Not present. Vag. Bleeding: None.  Movement: Present. Denies leaking of fluid.   The following portions of the patient's history were reviewed and updated as appropriate: allergies, current medications, past family history, past medical history, past social history, past surgical history and problem list. Problem list updated.  Objective:   Vitals:   08/09/20 1052  BP: 131/78  Pulse: 87  Weight: 202 lb 9.6 oz (91.9 kg)    Fetal Status: Fetal Heart Rate (bpm): 155   Movement: Present     General:  Alert, oriented and cooperative. Patient is in no acute distress.  Skin: Skin is warm and dry. No rash noted.   Cardiovascular: Normal heart rate noted  Respiratory: Normal respiratory effort, no problems with respiration noted  Abdomen: Soft, gravid, appropriate for gestational age.  Pain/Pressure: Absent     Pelvic: Cervical exam deferred        Extremities: Normal range of motion.     Mental Status: Normal mood and affect. Normal behavior. Normal judgment and thought content.   Assessment and Plan:  Pregnancy: G2P0101 at [redacted]w[redacted]d  1. Encounter for supervision of other normal pregnancy in second trimester - LOB - S/p anatomy scan at [place] last month. Record not received, requested today - Preemptive teaching GTT next visit  2. Hx of cesarean section - 2016 for funic presentation - Desires TOLAC, consent next visit   Preterm labor symptoms and general obstetric precautions including but not limited to vaginal bleeding, contractions, leaking  of fluid and fetal movement were reviewed in detail with the patient. Please refer to After Visit Summary for other counseling recommendations.  Return in about 4 weeks (around 09/06/2020) for MD for GTT and TOLAC consent.  Future Appointments  Date Time Provider Department Center  09/05/2020 11:15 AM Reva Bores, MD [facility]    Calvert Cantor, CNM

## 2020-08-17 ENCOUNTER — Encounter: Payer: Self-pay | Admitting: Radiology

## 2020-09-05 ENCOUNTER — Other Ambulatory Visit: Payer: Self-pay

## 2020-09-05 ENCOUNTER — Ambulatory Visit (INDEPENDENT_AMBULATORY_CARE_PROVIDER_SITE_OTHER): Payer: Self-pay | Admitting: Family Medicine

## 2020-09-05 VITALS — BP 115/73 | HR 82 | Wt 206.8 lb

## 2020-09-05 DIAGNOSIS — Z98891 History of uterine scar from previous surgery: Secondary | ICD-10-CM

## 2020-09-05 DIAGNOSIS — Z3482 Encounter for supervision of other normal pregnancy, second trimester: Secondary | ICD-10-CM

## 2020-09-05 DIAGNOSIS — Z3480 Encounter for supervision of other normal pregnancy, unspecified trimester: Secondary | ICD-10-CM

## 2020-09-05 NOTE — Patient Instructions (Signed)

## 2020-09-05 NOTE — Progress Notes (Signed)
   PRENATAL VISIT NOTE  Subjective:  Susan Owens is a 29 y.o. G2P0101 at [redacted]w[redacted]d being seen today for ongoing prenatal care.  She is currently monitored for the following issues for this low-risk pregnancy and has Obesity in pregnancy; Supervision of other normal pregnancy, antepartum; History of cesarean delivery; Obesity (BMI 30.0-34.9); and Low vitamin D level on their problem list.  Patient reports no complaints.  Contractions: Not present. Vag. Bleeding: None.  Movement: Present. Denies leaking of fluid.   The following portions of the patient's history were reviewed and updated as appropriate: allergies, current medications, past family history, past medical history, past social history, past surgical history and problem list.   Objective:   Vitals:   09/05/20 1039  BP: 115/73  Pulse: 82  Weight: 206 lb 12.8 oz (93.8 kg)    Fetal Status: Fetal Heart Rate (bpm): 148 Fundal Height: 28 cm Movement: Present     General:  Alert, oriented and cooperative. Patient is in no acute distress.  Skin: Skin is warm and dry. No rash noted.   Cardiovascular: Normal heart rate noted  Respiratory: Normal respiratory effort, no problems with respiration noted  Abdomen: Soft, gravid, appropriate for gestational age.  Pain/Pressure: Absent     Pelvic: Cervical exam deferred        Extremities: Normal range of motion.     Mental Status: Normal mood and affect. Normal behavior. Normal judgment and thought content.   Assessment and Plan:  Pregnancy: G2P0101 at [redacted]w[redacted]d 1. Encounter for supervision of other normal pregnancy in second trimester 28 wk labs Continue routine prenatal care. - Glucose Tolerance, 2 Hours w/1 Hour - CBC - RPR - HIV Antibody (routine testing w rflx)  2. History of cesarean delivery Desires TOLAC--consent signed.  Preterm labor symptoms and general obstetric precautions including but not limited to vaginal bleeding, contractions, leaking of fluid and fetal  movement were reviewed in detail with the patient. Please refer to After Visit Summary for other counseling recommendations.   Return in 2 weeks (on 09/19/2020).  Future Appointments  Date Time Provider Department Center  09/26/2020  3:15 PM Tereso Newcomer, MD [facility]  10/10/2020  9:30 AM Anyanwu, Jethro Bastos, MD [facility]  10/24/2020  9:15 AM [place] Bing, MD [facility]    Reva Bores, MD

## 2020-09-06 LAB — GLUCOSE TOLERANCE, 2 HOURS W/ 1HR
Glucose, 1 hour: 129 mg/dL (ref 65–179)
Glucose, 2 hour: 75 mg/dL (ref 65–152)
Glucose, Fasting: 74 mg/dL (ref 65–91)

## 2020-09-06 LAB — CBC
Hematocrit: 36.3 % (ref 34.0–46.6)
Hemoglobin: 12.3 g/dL (ref 11.1–15.9)
MCH: 30.5 pg (ref 26.6–33.0)
MCHC: 33.9 g/dL (ref 31.5–35.7)
MCV: 90 fL (ref 79–97)
Platelets: 336 10*3/uL (ref 150–450)
RBC: 4.03 x10E6/uL (ref 3.77–5.28)
RDW: 12.1 % (ref 11.7–15.4)
WBC: 8.4 10*3/uL (ref 3.4–10.8)

## 2020-09-06 LAB — RPR: RPR Ser Ql: NONREACTIVE

## 2020-09-06 LAB — HIV ANTIBODY (ROUTINE TESTING W REFLEX): HIV Screen 4th Generation wRfx: NONREACTIVE

## 2020-09-26 ENCOUNTER — Encounter: Payer: Self-pay | Admitting: Obstetrics & Gynecology

## 2020-09-26 ENCOUNTER — Other Ambulatory Visit: Payer: Self-pay

## 2020-09-26 ENCOUNTER — Ambulatory Visit (INDEPENDENT_AMBULATORY_CARE_PROVIDER_SITE_OTHER): Payer: Self-pay | Admitting: Obstetrics & Gynecology

## 2020-09-26 VITALS — BP 124/74 | HR 87 | Wt 209.4 lb

## 2020-09-26 DIAGNOSIS — Z98891 History of uterine scar from previous surgery: Secondary | ICD-10-CM

## 2020-09-26 DIAGNOSIS — Z3480 Encounter for supervision of other normal pregnancy, unspecified trimester: Secondary | ICD-10-CM

## 2020-09-26 DIAGNOSIS — R12 Heartburn: Secondary | ICD-10-CM

## 2020-09-26 DIAGNOSIS — Z3A30 30 weeks gestation of pregnancy: Secondary | ICD-10-CM

## 2020-09-26 DIAGNOSIS — O26893 Other specified pregnancy related conditions, third trimester: Secondary | ICD-10-CM

## 2020-09-26 MED ORDER — FAMOTIDINE 20 MG PO TABS: 20.0000 mg | ORAL_TABLET | Freq: Two times a day (BID) | ORAL | 3 refills | Status: DC

## 2020-09-26 NOTE — Patient Instructions (Signed)
Return to office for any scheduled appointments. Call the office or go to the [facility] at  if:  You begin to have strong, frequent contractions  Your water breaks.  Sometimes it is a big gush of fluid, sometimes it is just a trickle that keeps getting your panties wet or running down your legs  You have vaginal bleeding.  It is normal to have a small amount of spotting if your cervix was checked.   You do not feel your baby moving like normal.  If you do not, get something to eat and drink and lay down and focus on feeling your baby move.   If your baby is still not moving like normal, you should call the office or go to MAU.  Any other obstetric concerns.   

## 2020-09-26 NOTE — Progress Notes (Signed)
   PRENATAL VISIT NOTE  Subjective:  Susan Owens is a 29 y.o. G2P0101 at [redacted]w[redacted]d being seen today for ongoing prenatal care.  She is currently monitored for the following issues for this low-risk pregnancy and has Obesity in pregnancy; Supervision of other normal pregnancy, antepartum; History of cesarean delivery; Obesity (BMI 30.0-34.9); and Low vitamin D level on their problem list.  Patient reports heartburn , TUMS is not working.  Contractions: Not present. Vag. Bleeding: None.  Movement: Present. Denies leaking of fluid.   The following portions of the patient's history were reviewed and updated as appropriate: allergies, current medications, past family history, past medical history, past social history, past surgical history and problem list.   Objective:   Vitals:   09/26/20 1530  BP: 124/74  Pulse: 87  Weight: 209 lb 6.4 oz (95 kg)    Fetal Status: Fetal Heart Rate (bpm): 143 Fundal Height: 31 cm Movement: Present     General:  Alert, oriented and cooperative. Patient is in no acute distress.  Skin: Skin is warm and dry. No rash noted.   Cardiovascular: Normal heart rate noted  Respiratory: Normal respiratory effort, no problems with respiration noted  Abdomen: Soft, gravid, appropriate for gestational age.  Pain/Pressure: Absent     Pelvic: Cervical exam deferred        Extremities: Normal range of motion.     Mental Status: Normal mood and affect. Normal behavior. Normal judgment and thought content.   Assessment and Plan:  Pregnancy: G2P0101 at [redacted]w[redacted]d 1. Heartburn during pregnancy in third trimester Pepcid prescribed, will monitor response. - famotidine (PEPCID) 20 MG tablet; Take 1 tablet (20 mg total) by mouth 2 (two) times daily.  Dispense: 60 tablet; Refill: 3  2. Hx of cesarean section Desires TOLAC, consent signed 09/05/2020  3. [redacted] weeks gestation of pregnancy 4. Supervision of other normal pregnancy, antepartum No complaints. Preterm labor  symptoms and general obstetric precautions including but not limited to vaginal bleeding, contractions, leaking of fluid and fetal movement were reviewed in detail with the patient. Please refer to After Visit Summary for other counseling recommendations.   Return in about 2 weeks (around 10/10/2020) for OFFICE OB VISIT (MD or APP).  Future Appointments  Date Time Provider Department Center  10/10/2020  9:30 AM Achaia Garlock, Jethro Bastos, MD [facility]  10/24/2020  9:15 AM [place] Bing, MD [facility]    Jaynie Collins, MD

## 2020-09-30 NOTE — L&D Delivery Note (Addendum)
OB/GYN Faculty Practice Delivery Note  Susan Owens is a 30 y.o. G2P0101 s/p VBAC at [redacted]w[redacted]d. She was admitted for post-dates IOL.   ROM: 10h 4m with clear fluid GBS Status: positive, adequate clindamycin prior to delivery Maximum Maternal Temperature: 98.9 F  Labor Progress: On admission, FB was placed and pt was started on pitocin. She had AROM for clear fluid at 1515 on 3/13. Given recurrent variable decelerations, amnioinfusion was initiated at 1845. She then progressed to complete cervical dilation and had an uncomplicated delivery as noted below.  Delivery Date/Time: 12/11/20 at 0127 Delivery: Called to room and patient was complete and pushing. Head delivered LOA. No nuchal cord present. Shoulder and body delivered in usual fashion. Infant with spontaneous cry, placed on mother's abdomen, dried and stimulated. Cord clamped x 2 after 1-minute delay, and cut by FOB under my direct supervision. Cord blood drawn. Placenta delivered spontaneously with gentle cord traction. Fundus firm with massage and Pitocin. Labia, perineum, vagina, and cervix were inspected with lacerations as listed below.  Placenta: 3-vessel cord, intact, sent to L&D Complications: None Lacerations: Right sulcal, right labial, 2nd degree perineal s/p repair in standard fashion with use of 3-0 vicryl. Left hemostatic periurethral laceration without repair. EBL: 562 mL Analgesia: Epidural  Infant: viable female "Axel"  APGARs 9 and 9  weight per medical record  Henrietta Hoover MD, PGY1   I was present and gloved for delivery of infant and placenta as noted above. I assisted with laceration repair as noted above.  Sheila Oats, MD OB Fellow, Faculty Practice 12/11/2020 2:52 AM

## 2020-10-10 ENCOUNTER — Other Ambulatory Visit: Payer: Self-pay

## 2020-10-10 ENCOUNTER — Telehealth (INDEPENDENT_AMBULATORY_CARE_PROVIDER_SITE_OTHER): Payer: Self-pay | Admitting: Obstetrics & Gynecology

## 2020-10-10 VITALS — BP 113/78 | HR 97 | Wt 211.0 lb

## 2020-10-10 DIAGNOSIS — R059 Cough, unspecified: Secondary | ICD-10-CM

## 2020-10-10 DIAGNOSIS — Z98891 History of uterine scar from previous surgery: Secondary | ICD-10-CM

## 2020-10-10 DIAGNOSIS — O34219 Maternal care for unspecified type scar from previous cesarean delivery: Secondary | ICD-10-CM

## 2020-10-10 DIAGNOSIS — O99891 Other specified diseases and conditions complicating pregnancy: Secondary | ICD-10-CM

## 2020-10-10 DIAGNOSIS — Z3A32 32 weeks gestation of pregnancy: Secondary | ICD-10-CM

## 2020-10-10 DIAGNOSIS — G4700 Insomnia, unspecified: Secondary | ICD-10-CM

## 2020-10-10 DIAGNOSIS — Z3480 Encounter for supervision of other normal pregnancy, unspecified trimester: Secondary | ICD-10-CM

## 2020-10-10 DIAGNOSIS — O99353 Diseases of the nervous system complicating pregnancy, third trimester: Secondary | ICD-10-CM

## 2020-10-10 NOTE — Patient Instructions (Signed)
Return to office for any scheduled appointments. Call the office or go to the MAU at [facility] [place] if:  You begin to have strong, frequent contractions  Your water breaks.  Sometimes it is a big gush of fluid, sometimes it is just a trickle that keeps getting your panties wet or running down your legs  You have vaginal bleeding.  It is normal to have a small amount of spotting if your cervix was checked.   You do not feel your baby moving like normal.  If you do not, get something to eat and drink and lay down and focus on feeling your baby move.   If your baby is still not moving like normal, you should call the office or go to MAU.  Any other obstetric concerns.   

## 2020-10-10 NOTE — Progress Notes (Signed)
   OBSTETRICS PRENATAL VIRTUAL VISIT ENCOUNTER NOTE  Provider location: Center for [facility] at [facility]   I connected with Martyn Ehrich on 10/10/20 at  9:30 AM EST by MyChart Video Encounter at home and verified that I am speaking with the correct person using two identifiers.   I discussed the limitations, risks, security and privacy concerns of performing an evaluation and management service virtually and the availability of in person appointments. I also discussed with the patient that there may be a patient responsible charge related to this service. The patient expressed understanding and agreed to proceed. Subjective:  Susan Owens is a 30 y.o. G2P0101 at [redacted]w[redacted]d being seen today for ongoing prenatal care.  She is currently monitored for the following issues for this low-risk pregnancy and has Obesity in pregnancy; Supervision of other normal pregnancy, antepartum; History of cesarean delivery; Obesity (BMI 30.0-34.9); and Low vitamin D level on their problem list.  Patient reports coughing for a few days, COVID test pending. No SOB, CP, fevers.  Has insomnia for several weeks now.  Contractions: Not present. Vag. Bleeding: None.  Movement: Present. Denies any leaking of fluid.   The following portions of the patient's history were reviewed and updated as appropriate: allergies, current medications, past family history, past medical history, past social history, past surgical history and problem list.   Objective:   Vitals:   10/10/20 0928  BP: 113/78  Pulse: 97  Weight: 211 lb (95.7 kg)    Fetal Status:     Movement: Present     General:  Alert, oriented and cooperative. Patient is in no acute distress.  Respiratory: Normal respiratory effort, no problems with respiration noted  Mental Status: Normal mood and affect. Normal behavior. Normal judgment and thought content.  Rest of physical exam deferred due to type of encounter  Imaging: No  results found.  Assessment and Plan:  Pregnancy: G2P0101 at [redacted]w[redacted]d 1. Cough Encouraged to take OTC cough medicine. She will let us know of COVID results. Was told of symptoms to make her go to ER if she she has COVID.  2. Insomnia, unspecified type Benadryl recommended.  3. [redacted] weeks gestation of pregnancy 4. History of cesarean delivery 5. Supervision of other normal pregnancy, antepartum Normal third trimester labs, reviewed with patient. Desires TOLAC, consent signed 09/05/2020. Preterm labor symptoms and general obstetric precautions including but not limited to vaginal bleeding, contractions, leaking of fluid and fetal movement were reviewed in detail with the patient. I discussed the assessment and treatment plan with the patient. The patient was provided an opportunity to ask questions and all were answered. The patient agreed with the plan and demonstrated an understanding of the instructions. The patient was advised to call back or seek an in-person office evaluation/go to MAU at [facility] for any urgent or concerning symptoms. Please refer to After Visit Summary for other counseling recommendations.   I provided 10 minutes of face-to-face time during this encounter.  Return in about 2 weeks (around 10/24/2020) for Virtual OB Visit (MD or APP).  Future Appointments  Date Time Provider Department Center  10/24/2020  9:15 AM Addy Bing, MD [facility]    Jaynie Collins, MD Center for [facility], [facility]

## 2020-10-24 ENCOUNTER — Telehealth (INDEPENDENT_AMBULATORY_CARE_PROVIDER_SITE_OTHER): Payer: Self-pay | Admitting: Obstetrics and Gynecology

## 2020-10-24 ENCOUNTER — Other Ambulatory Visit: Payer: Self-pay

## 2020-10-24 VITALS — BP 115/69 | HR 75 | Wt 210.0 lb

## 2020-10-24 DIAGNOSIS — O99210 Obesity complicating pregnancy, unspecified trimester: Secondary | ICD-10-CM

## 2020-10-24 DIAGNOSIS — E669 Obesity, unspecified: Secondary | ICD-10-CM

## 2020-10-24 DIAGNOSIS — Z3480 Encounter for supervision of other normal pregnancy, unspecified trimester: Secondary | ICD-10-CM

## 2020-10-24 DIAGNOSIS — O34219 Maternal care for unspecified type scar from previous cesarean delivery: Secondary | ICD-10-CM

## 2020-10-24 DIAGNOSIS — O99213 Obesity complicating pregnancy, third trimester: Secondary | ICD-10-CM

## 2020-10-24 DIAGNOSIS — Z3A34 34 weeks gestation of pregnancy: Secondary | ICD-10-CM

## 2020-10-24 NOTE — Progress Notes (Signed)
   TELEHEALTH VIRTUAL OBSTETRICS VISIT ENCOUNTER NOTE  Clinic: [facility]  I connected with Martyn Ehrich on 10/24/20 at  9:15 AM EST by telephone at home and verified that I am speaking with the correct person using two identifiers.   I discussed the limitations, risks, security and privacy concerns of performing an evaluation and management service by telephone and the availability of in person appointments. I also discussed with the patient that there may be a patient responsible charge related to this service. The patient expressed understanding and agreed to proceed.  Subjective:  Susan Owens is a 30 y.o. G2P0101 at [redacted]w[redacted]d being followed for ongoing prenatal care.  She is currently monitored for the following issues for this low-risk pregnancy and has Obesity in pregnancy; Supervision of other normal pregnancy, antepartum; History of cesarean delivery; Obesity (BMI 30.0-34.9); and Low vitamin D level on their problem list.  Patient reports no complaints. Reports fetal movement. Denies any contractions, bleeding or leaking of fluid.   The following portions of the patient's history were reviewed and updated as appropriate: allergies, current medications, past family history, past medical history, past social history, past surgical history and problem list.   Objective:   Vitals:   10/24/20 0938  BP: 115/69  Pulse: 75  Weight: 210 lb (95.3 kg)    Babyscripts Data Reviewed: not applicable  General:  Alert, oriented and cooperative.   Mental Status: Normal mood and affect perceived. Normal judgment and thought content.  Rest of physical exam deferred due to type of encounter  Assessment and Plan:  Pregnancy: G2P0101 at [redacted]w[redacted]d 1. Supervision of other normal pregnancy, antepartum Routine care.   2. History of cesarean Still desires tolac. S/p consent signing  3. Obesity (BMI 30.0-34.9)  4. [redacted] weeks gestation of pregnancy  Preterm  labor symptoms and general obstetric precautions including but not limited to vaginal bleeding, contractions, leaking of fluid and fetal movement were reviewed in detail with the patient.  I discussed the assessment and treatment plan with the patient. The patient was provided an opportunity to ask questions and all were answered. The patient agreed with the plan and demonstrated an understanding of the instructions. The patient was advised to call back or seek an in-person office evaluation/go to MAU at [facility] for any urgent or concerning symptoms. Please refer to After Visit Summary for other counseling recommendations.   I provided 7 minutes of non-face-to-face time during this encounter. The visit was conducted via MyChart-medicine  Return in about 2 weeks (around 11/07/2020) for low risk, in person, md or app.  Future Appointments  Date Time Provider Department Center  11/08/2020  9:15 AM Federico Flake, MD [facility]    Dona Ana Bing, MD Center for [facility], [facility]

## 2020-11-08 ENCOUNTER — Other Ambulatory Visit (HOSPITAL_COMMUNITY)
Admission: RE | Admit: 2020-11-08 | Discharge: 2020-11-08 | Disposition: A | Discharge status: Home / Self Care | Payer: Self-pay | Source: Ambulatory Visit | Attending: Family Medicine | Admitting: Family Medicine

## 2020-11-08 ENCOUNTER — Ambulatory Visit (INDEPENDENT_AMBULATORY_CARE_PROVIDER_SITE_OTHER): Payer: Self-pay | Admitting: Family Medicine

## 2020-11-08 ENCOUNTER — Other Ambulatory Visit: Payer: Self-pay

## 2020-11-08 VITALS — BP 116/73 | HR 92 | Wt 207.4 lb

## 2020-11-08 DIAGNOSIS — R12 Heartburn: Secondary | ICD-10-CM

## 2020-11-08 DIAGNOSIS — O26893 Other specified pregnancy related conditions, third trimester: Secondary | ICD-10-CM

## 2020-11-08 DIAGNOSIS — Z98891 History of uterine scar from previous surgery: Secondary | ICD-10-CM

## 2020-11-08 DIAGNOSIS — Z3480 Encounter for supervision of other normal pregnancy, unspecified trimester: Secondary | ICD-10-CM | POA: Insufficient documentation

## 2020-11-08 MED ORDER — FAMOTIDINE 20 MG PO TABS
20.0000 mg | ORAL_TABLET | Freq: Two times a day (BID) | ORAL | 3 refills | Status: DC
Start: 2020-11-08 — End: 2020-12-12

## 2020-11-08 NOTE — Progress Notes (Signed)
   PRENATAL VISIT NOTE  Subjective:  Susan Owens is a 30 y.o. G2P0101 at [redacted]w[redacted]d being seen today for ongoing prenatal care.  She is currently monitored for the following issues for this high-risk pregnancy and has Obesity in pregnancy; Supervision of other normal pregnancy, antepartum; History of cesarean delivery; Obesity (BMI 30.0-34.9); and Low vitamin D level on their problem list.  Patient reports no complaints.  Contractions: Not present. Vag. Bleeding: None.  Movement: Present. Denies leaking of fluid.   The following portions of the patient's history were reviewed and updated as appropriate: allergies, current medications, past family history, past medical history, past social history, past surgical history and problem list.   Objective:   Vitals:   11/08/20 0918  BP: 116/73  Pulse: 92  Weight: 207 lb 6.4 oz (94.1 kg)    Fetal Status: Fetal Heart Rate (bpm): 153   Movement: Present     General:  Alert, oriented and cooperative. Patient is in no acute distress.  Skin: Skin is warm and dry. No rash noted.   Cardiovascular: Normal heart rate noted  Respiratory: Normal respiratory effort, no problems with respiration noted  Abdomen: Soft, gravid, appropriate for gestational age.  Pain/Pressure: Present     Pelvic: Cervical exam deferred        Extremities: Normal range of motion.     Mental Status: Normal mood and affect. Normal behavior. Normal judgment and thought content.   Assessment and Plan:  Pregnancy: G2P0101 at [redacted]w[redacted]d 1. Supervision of other normal pregnancy, antepartum Up to date - Strep Gp B Culture+Rflx - GC/Chlamydia probe amp ([place])not at [facility]  2. History of cesarean delivery Confirmed TOLAC desire - Strep Gp B Culture+Rflx - GC/Chlamydia probe amp ([place])not at [facility]  3. Heartburn during pregnancy in third trimester - famotidine (PEPCID) 20 MG tablet; Take 1 tablet (20 mg total) by mouth 2 (two) times daily.  Dispense: 60 tablet;  Refill: 3  Preterm labor symptoms and general obstetric precautions including but not limited to vaginal bleeding, contractions, leaking of fluid and fetal movement were reviewed in detail with the patient. Please refer to After Visit Summary for other counseling recommendations.   Return in about 1 week (around 11/15/2020) for Routine prenatal care.  No future appointments.  Federico Flake, MD

## 2020-11-08 NOTE — Progress Notes (Signed)
Refill on pepcid

## 2020-11-09 LAB — GC/CHLAMYDIA PROBE AMP (~~LOC~~) NOT AT ARMC
Chlamydia: NEGATIVE
Comment: NEGATIVE
Comment: NORMAL
Neisseria Gonorrhea: NEGATIVE

## 2020-11-12 LAB — STREP GP B SUSCEPTIBILITY

## 2020-11-12 LAB — STREP GP B CULTURE+RFLX: Strep Gp B Culture+Rflx: POSITIVE — AB

## 2020-11-13 ENCOUNTER — Encounter: Payer: Self-pay | Admitting: Family Medicine

## 2020-11-13 DIAGNOSIS — O99820 Streptococcus B carrier state complicating pregnancy: Secondary | ICD-10-CM | POA: Insufficient documentation

## 2020-11-14 ENCOUNTER — Telehealth (INDEPENDENT_AMBULATORY_CARE_PROVIDER_SITE_OTHER): Payer: Self-pay | Admitting: Obstetrics & Gynecology

## 2020-11-14 ENCOUNTER — Telehealth: Payer: Self-pay | Admitting: Obstetrics & Gynecology

## 2020-11-14 VITALS — BP 120/84 | HR 89 | Wt 209.0 lb

## 2020-11-14 DIAGNOSIS — E669 Obesity, unspecified: Secondary | ICD-10-CM

## 2020-11-14 DIAGNOSIS — Z98891 History of uterine scar from previous surgery: Secondary | ICD-10-CM

## 2020-11-14 DIAGNOSIS — O99213 Obesity complicating pregnancy, third trimester: Secondary | ICD-10-CM

## 2020-11-14 DIAGNOSIS — O34219 Maternal care for unspecified type scar from previous cesarean delivery: Secondary | ICD-10-CM

## 2020-11-14 DIAGNOSIS — Z3A37 37 weeks gestation of pregnancy: Secondary | ICD-10-CM

## 2020-11-14 DIAGNOSIS — O99820 Streptococcus B carrier state complicating pregnancy: Secondary | ICD-10-CM

## 2020-11-14 DIAGNOSIS — Z3480 Encounter for supervision of other normal pregnancy, unspecified trimester: Secondary | ICD-10-CM

## 2020-11-14 NOTE — Progress Notes (Signed)
    TELEHEALTH OBSTETRICS VISIT ENCOUNTER NOTE  Provider location: Center for [facility] at [facility]   I connected with Martyn Ehrich on 11/14/20 at  2:45 PM EST by telephone at home and verified that I am speaking with the correct person using two identifiers. Of note, unable to do video encounter due to technical difficulties.    I discussed the limitations, risks, security and privacy concerns of performing an evaluation and management service by telephone and the availability of in person appointments. I also discussed with the patient that there may be a patient responsible charge related to this service. The patient expressed understanding and agreed to proceed.  Subjective:  Susan Owens is a 30 y.o. G2P0101 at [redacted]w[redacted]d being followed for ongoing prenatal care.  She is currently monitored for the following issues for this low-risk pregnancy and has Obesity in pregnancy; Supervision of other normal pregnancy, antepartum; History of cesarean delivery; Obesity (BMI 30.0-34.9); Low vitamin D level; and GBS (group B Streptococcus carrier), +RV culture, currently pregnant on their problem list.  Patient reports no complaints. Reports fetal movement. Denies any contractions, bleeding or leaking of fluid.   The following portions of the patient's history were reviewed and updated as appropriate: allergies, current medications, past family history, past medical history, past social history, past surgical history and problem list.   Objective:  Blood pressure 120/84, pulse 89, weight 209 lb (94.8 kg), last menstrual period 02/27/2020, currently breastfeeding. General:  Alert, oriented and cooperative.   Mental Status: Normal mood and affect perceived. Normal judgment and thought content.  Rest of physical exam deferred due to type of encounter  Assessment and Plan:  Pregnancy: G2P0101 at [redacted]w[redacted]d 1. History of cesarean delivery Desires TOLAC.  2. GBS (group B  Streptococcus carrier), +RV culture, currently pregnant Clindamycin sensitive, will receive this as intrapartum prophylaxis  3. [redacted] weeks gestation of pregnancy  4. Supervision of other normal pregnancy, antepartum Labor symptoms and general obstetric precautions including but not limited to vaginal bleeding, contractions, leaking of fluid and fetal movement were reviewed in detail with the patient.  I discussed the assessment and treatment plan with the patient. The patient was provided an opportunity to ask questions and all were answered. The patient agreed with the plan and demonstrated an understanding of the instructions. The patient was advised to call back or seek an in-person office evaluation/go to MAU at [facility] for any urgent or concerning symptoms. Please refer to After Visit Summary for other counseling recommendations.   I provided 7 minutes of non-face-to-face time during this encounter.  Return in about 1 week (around 11/21/2020) for OFFICE OB VISIT (MD only) as scheduled.  Future Appointments  Date Time Provider Department Center  11/21/2020 11:45 AM [place] Bing, MD [facility]  11/28/2020  9:00 AM Azariel Banik, Jethro Bastos, MD [facility]    Jaynie Collins, MD Center for [facility], [facility]

## 2020-11-14 NOTE — Patient Instructions (Signed)
Return to office for any scheduled appointments. Call the office or go to the MAU at [facility] [place] if:  You begin to have strong, frequent contractions  Your water breaks.  Sometimes it is a big gush of fluid, sometimes it is just a trickle that keeps getting your panties wet or running down your legs  You have vaginal bleeding.  It is normal to have a small amount of spotting if your cervix was checked.   You do not feel your baby moving like normal.  If you do not, get something to eat and drink and lay down and focus on feeling your baby move.   If your baby is still not moving like normal, you should call the office or go to MAU.  Any other obstetric concerns.   

## 2020-11-21 ENCOUNTER — Encounter: Payer: Self-pay | Admitting: Obstetrics and Gynecology

## 2020-11-22 ENCOUNTER — Other Ambulatory Visit: Payer: Self-pay

## 2020-11-22 ENCOUNTER — Encounter: Payer: Self-pay | Admitting: Family Medicine

## 2020-11-22 ENCOUNTER — Telehealth (INDEPENDENT_AMBULATORY_CARE_PROVIDER_SITE_OTHER): Payer: Self-pay | Admitting: Family Medicine

## 2020-11-22 VITALS — BP 112/90 | HR 95 | Wt 210.0 lb

## 2020-11-22 DIAGNOSIS — O99820 Streptococcus B carrier state complicating pregnancy: Secondary | ICD-10-CM

## 2020-11-22 DIAGNOSIS — Z3A38 38 weeks gestation of pregnancy: Secondary | ICD-10-CM

## 2020-11-22 DIAGNOSIS — Z98891 History of uterine scar from previous surgery: Secondary | ICD-10-CM

## 2020-11-22 DIAGNOSIS — O99213 Obesity complicating pregnancy, third trimester: Secondary | ICD-10-CM

## 2020-11-22 DIAGNOSIS — O34219 Maternal care for unspecified type scar from previous cesarean delivery: Secondary | ICD-10-CM

## 2020-11-22 DIAGNOSIS — Z3480 Encounter for supervision of other normal pregnancy, unspecified trimester: Secondary | ICD-10-CM

## 2020-11-22 DIAGNOSIS — O99210 Obesity complicating pregnancy, unspecified trimester: Secondary | ICD-10-CM

## 2020-11-22 NOTE — Progress Notes (Signed)
I connected with  Susan Owens on 11/22/20 at  1:30 PM EST by telephone and verified that I am speaking with the correct person using two identifiers.   I discussed the limitations, risks, security and privacy concerns of performing an evaluation and management service by telephone and the availability of in person appointments. I also discussed with the patient that there may be a patient responsible charge related to this service. The patient expressed understanding and agreed to proceed.  Scheryl Marten, RN 11/22/2020  1:39 PM

## 2020-11-22 NOTE — Progress Notes (Addendum)
I connected with Susan Owens 11/22/20 at  1:30 PM EST by: MyChart video and verified that I am speaking with the correct person using two identifiers.  Patient is located at home and provider is located at [facility].     The purpose of this virtual visit is to provide medical care while limiting exposure to the novel coronavirus. I discussed the limitations, risks, security and privacy concerns of performing an evaluation and management service by MyChart video and the availability of in person appointments. I also discussed with the patient that there may be a patient responsible charge related to this service. By engaging in this virtual visit, you consent to the provision of healthcare.  Additionally, you authorize for your insurance to be billed for the services provided during this visit.  The patient expressed understanding and agreed to proceed.  The following staff members participated in the virtual visit:  Scheryl Marten    PRENATAL VISIT NOTE  Subjective:  Susan Owens is a 30 y.o. G2P0101 at [redacted]w[redacted]d  for video visit for ongoing prenatal care.  She is currently monitored for the following issues for this high-risk pregnancy and has Obesity in pregnancy; Supervision of other normal pregnancy, antepartum; History of cesarean delivery; Obesity (BMI 30.0-34.9); Low vitamin D level; and GBS (group B Streptococcus carrier), +RV culture, currently pregnant on their problem list.  Patient reports no complaints.  Contractions: Irritability. Vag. Bleeding: None.  Movement: Present. Denies leaking of fluid.   The following portions of the patient's history were reviewed and updated as appropriate: allergies, current medications, past family history, past medical history, past social history, past surgical history and problem list.   Objective:   Vitals:   11/22/20 1338  BP: 112/90  Pulse: 95  Weight: 210 lb (95.3 kg)   Self-Obtained  Fetal Status:     Movement:  Present     Assessment and Plan:  Pregnancy: G2P0101 at [redacted]w[redacted]d  1. GBS (group B Streptococcus carrier), +RV culture, currently pregnant Clindamycin in labor  2. Supervision of other normal pregnancy, antepartum Up to date Reviewed OB box in detail and updated today with FOB. Desires IUD outpatient, discussed health department for IUD insertion has most cost effective.   3. History of cesarean delivery Confirmed TOLAC  4. Obesity in pregnancy TWG= 27 lb (12.2 kg) which is above goal of 11-15lbs.    Term labor symptoms and general obstetric precautions including but not limited to vaginal bleeding, contractions, leaking of fluid and fetal movement were reviewed in detail with the patient.  Return in about 1 week (around 11/29/2020) for Routine prenatal care, in person.  Future Appointments  Date Time Provider Department Center  11/28/2020  9:00 AM Anyanwu, Jethro Bastos, MD [facility]    Time spent on virtual visit: 15 minutes  Federico Flake, MD

## 2020-11-28 ENCOUNTER — Telehealth (HOSPITAL_COMMUNITY): Payer: Self-pay | Admitting: *Deleted

## 2020-11-28 ENCOUNTER — Ambulatory Visit (INDEPENDENT_AMBULATORY_CARE_PROVIDER_SITE_OTHER): Payer: Self-pay | Admitting: Obstetrics & Gynecology

## 2020-11-28 ENCOUNTER — Other Ambulatory Visit: Payer: Self-pay

## 2020-11-28 ENCOUNTER — Encounter (HOSPITAL_COMMUNITY): Payer: Self-pay | Admitting: *Deleted

## 2020-11-28 ENCOUNTER — Encounter: Payer: Self-pay | Admitting: Obstetrics & Gynecology

## 2020-11-28 VITALS — BP 119/82 | HR 94 | Wt 211.2 lb

## 2020-11-28 DIAGNOSIS — Z98891 History of uterine scar from previous surgery: Secondary | ICD-10-CM

## 2020-11-28 DIAGNOSIS — O99820 Streptococcus B carrier state complicating pregnancy: Secondary | ICD-10-CM

## 2020-11-28 DIAGNOSIS — Z3A39 39 weeks gestation of pregnancy: Secondary | ICD-10-CM

## 2020-11-28 DIAGNOSIS — Z3480 Encounter for supervision of other normal pregnancy, unspecified trimester: Secondary | ICD-10-CM

## 2020-11-28 NOTE — Telephone Encounter (Signed)
Preadmission screen  

## 2020-11-28 NOTE — Patient Instructions (Addendum)
Return to office for any scheduled appointments. Call the office or go to the MAU at [facility] at [facility] if:  You begin to have strong, frequent contractions  Your water breaks.  Sometimes it is a big gush of fluid, sometimes it is just a trickle that keeps getting your panties wet or running down your legs  You have vaginal bleeding.  It is normal to have a small amount of spotting if your cervix was checked.   You do not feel your baby moving like normal.  If you do not, get something to eat and drink and lay down and focus on feeling your baby move.   If your baby is still not moving like normal, you should call the office or go to MAU.  Any other obstetric concerns.  OUTPATIENT FOLEY BULB INDUCTION OF LABOR:  Information Sheet for Mothers and Family               What's a Foley Bulb Induction? A Foley bulb induction is a procedure where your provider inserts a catheter into your cervix. Once inside your womb, your provider inflates the balloon with a saline solution.   This puts pressure on your cervix and encourages dilation. The catheter falls out once your cervix dilates to 3-4 centimeters.     With any procedure, it's important that you know what to expect. The insertion of a Foley catheter can be a bit uncomfortable, and some women experience sharp pelvic pain. The pain may subside once the catheter is in place. You may experience some cramping when the Foley catheter is in place.  This is normal.     GO TO THE MATERNITY ADMISSIONS UNIT FOR THE FOLLOWING:  Heavy vaginal bleeding  Rupture of membranes (fluid that wets your underwear)  Painful uterine contractions every 5 minutes or less  Severe abdominal discomfort  Decreased movement of the baby   Labor Induction Labor induction is when steps are taken to cause a pregnant woman to begin the labor process. Most women go into labor on their own between 37 weeks and 42 weeks of pregnancy. When this  does not happen, or when there is a medical need for labor to begin, steps may be taken to induce, or bring on, labor. Labor induction causes a pregnant woman's uterus to contract. It also causes the cervix to soften (ripen), open (dilate), and thin out. Usually, labor is not induced before 39 weeks of pregnancy unless there is a medical reason to do so. When is labor induction considered? Labor induction may be right for you if:  Your pregnancy lasts longer than 41 to 42 weeks.  Your placenta is separating from your uterus (placental abruption).  You have a rupture of membranes and your labor does not begin.  You have health problems, like diabetes or high blood pressure (preeclampsia) during your pregnancy.  Your baby has stopped growing or does not have enough amniotic fluid. Before labor induction begins, your health care provider will consider the following factors:  Your medical condition and the baby's condition.  How many weeks you have been pregnant.  How mature the baby's lungs are.  The condition of your cervix.  The position of the baby.  The size of your birth canal. Tell a health care provider about:  Any allergies you have.  All medicines you are taking, including vitamins, herbs, eye drops, creams, and over-the-counter medicines.  Any problems you or your family members have had with anesthetic  medicines.  Any surgeries you have had.  Any blood disorders you have.  Any medical conditions you have. What are the risks? Generally, this is a safe procedure. However, problems may occur, including:  Failed induction.  Changes in fetal heart rate, such as being too high, too low, or irregular (erratic).  Infection in the mother or the baby.  Increased risk of having a cesarean delivery.  Breaking off (abruption) of the placenta from the uterus. This is rare.  Rupture of the uterus. This is very rare.  Your baby could fail to get enough blood flow or  oxygen. This can be life-threatening. When induction is needed for medical reasons, the benefits generally outweigh the risks. What happens during the procedure? During the procedure, your health care provider will use one of these methods to induce labor:  Stripping the membranes. In this method, the amniotic sac tissue is gently separated from the cervix. This causes the following to happen: ? Your cervix stretches, which in turn causes the release of prostaglandins. ? Prostaglandins induce labor and cause the uterus to contract. ? This procedure is often done in an office visit. You will be sent home to wait for contractions to begin.  Prostaglandin medicine. This medicine starts contractions and causes the cervix to dilate and ripen. This can be taken by mouth (orally) or by being inserted into the vagina (suppository).  Inserting a small, thin tube (catheter) with a balloon into the vagina and then expanding the balloon with water to dilate the cervix.  Breaking the water. In this method, a small instrument is used to make a small hole in the amniotic sac. This eventually causes the amniotic sac to break. Contractions should begin within a few hours.  Medicine to trigger or strengthen contractions. This medicine is given through an IV that is inserted into a vein in your arm. This procedure may vary among health care providers and hospitals.   Where to find more information  March of Dimes: www.marchofdimes.org  [employer] of Obstetricians and Gynecologists: www.acog.org Summary  Labor induction causes a pregnant woman's uterus to contract. It also causes the cervix to soften (ripen), open (dilate), and thin out.  Labor is usually not induced before 39 weeks of pregnancy unless there is a medical reason to do so.  When induction is needed for medical reasons, the benefits generally outweigh the risks.  Talk with your health care provider about which methods of labor  induction are right for you. This information is not intended to replace advice given to you by your health care provider. Make sure you discuss any questions you have with your health care provider. Document Revised: 06/29/2020 Document Reviewed: 06/29/2020 Elsevier Patient Education  2021 [employer].

## 2020-11-28 NOTE — Progress Notes (Signed)
   PRENATAL VISIT NOTE  Subjective:  Susan Owens is a 30 y.o. G2P0101 at [redacted]w[redacted]d being seen today for ongoing prenatal care.  She is currently monitored for the following issues for this low-risk pregnancy and has Obesity in pregnancy; Supervision of other normal pregnancy, antepartum; History of cesarean delivery; Obesity (BMI 30.0-34.9); Low vitamin D level; and GBS (group B Streptococcus carrier), +RV culture, currently pregnant on their problem list.  Patient reports no complaints.  Contractions: Irregular. Vag. Bleeding: None.  Movement: Present. Denies leaking of fluid.   The following portions of the patient's history were reviewed and updated as appropriate: allergies, current medications, past family history, past medical history, past social history, past surgical history and problem list.   Objective:   Vitals:   11/28/20 0909  BP: 119/82  Pulse: 94  Weight: 211 lb 3.2 oz (95.8 kg)    Fetal Status: Fetal Heart Rate (bpm): 155 Fundal Height: 39 cm Movement: Present     General:  Alert, oriented and cooperative. Patient is in no acute distress.  Skin: Skin is warm and dry. No rash noted.   Cardiovascular: Normal heart rate noted  Respiratory: Normal respiratory effort, no problems with respiration noted  Abdomen: Soft, gravid, appropriate for gestational age.  Pain/Pressure: Present     Pelvic: Cervical exam deferred        Extremities: Normal range of motion.  Edema: None  Mental Status: Normal mood and affect. Normal behavior. Normal judgment and thought content.   Assessment and Plan:  Pregnancy: G2P0101 at [redacted]w[redacted]d 1. GBS (group B Streptococcus carrier), +RV culture, currently pregnant Clindamycin for intrapartum prophylaxis  2. History of cesarean delivery 3. [redacted] weeks gestation of pregnancy 4. Supervision of other normal pregnancy, antepartum Wants TOLAC.  Discussed IOL at 41 weeks to maximize chances of spontaneous labor, also talked about need for post  term antenatal testing.  Risks and benefits of induction were reviewed, including failure of method, prolonged labor, need for further intervention, risk of cesarean section.  Patient understand these risks and wish to proceed.  Options of foley bulb, artificial rupture of membranes, and pitocin reviewed, with use of each discussed in detail. All questions answered.  Induction of labor scheduled, orders have been signed and held. Offered outpatient foley placement for cervical ripening, she will consider this, information was given to her to review at home. She was told to expect a call from [facility] L[facility]D with further instructions about her pre-admission COVID screening and any further instructions about the induction of labor. She was told that the only scheduled time slot was midnight, patients are called in during the morning between 6:30am -9 am to come in for their induction as soon as the rooms and staff are ready for them.   Term labor symptoms and general obstetric precautions including but not limited to vaginal bleeding, contractions, leaking of fluid and fetal movement were reviewed in detail with the patient. Please refer to After Visit Summary for other counseling recommendations.   Return in about 1 week (around 12/05/2020) for 40 week  NST, AFI, OFFICE OB VISIT (MD or APP).  No future appointments.  Jaynie Collins, MD

## 2020-12-06 ENCOUNTER — Other Ambulatory Visit: Payer: Self-pay | Admitting: Advanced Practice Midwife

## 2020-12-06 ENCOUNTER — Ambulatory Visit (INDEPENDENT_AMBULATORY_CARE_PROVIDER_SITE_OTHER): Payer: Medicaid Other | Admitting: Family Medicine

## 2020-12-06 ENCOUNTER — Other Ambulatory Visit: Payer: Self-pay

## 2020-12-06 VITALS — BP 135/83 | HR 82 | Wt 211.0 lb

## 2020-12-06 DIAGNOSIS — Z98891 History of uterine scar from previous surgery: Secondary | ICD-10-CM

## 2020-12-06 DIAGNOSIS — O99210 Obesity complicating pregnancy, unspecified trimester: Secondary | ICD-10-CM

## 2020-12-06 DIAGNOSIS — Z3A40 40 weeks gestation of pregnancy: Secondary | ICD-10-CM

## 2020-12-06 DIAGNOSIS — O99820 Streptococcus B carrier state complicating pregnancy: Secondary | ICD-10-CM

## 2020-12-06 DIAGNOSIS — Z3480 Encounter for supervision of other normal pregnancy, unspecified trimester: Secondary | ICD-10-CM

## 2020-12-06 NOTE — Progress Notes (Signed)
   PRENATAL VISIT NOTE  Subjective:  Susan Owens is a 30 y.o. G2P0101 at [redacted]w[redacted]d being seen today for ongoing prenatal care.  She is currently monitored for the following issues for this high-risk pregnancy and has Obesity in pregnancy; Supervision of other normal pregnancy, antepartum; History of cesarean delivery; Obesity (BMI 30.0-34.9); Low vitamin D level; and GBS (group B Streptococcus carrier), +RV culture, currently pregnant on their problem list.  Patient reports no complaints.  Contractions: Irregular. Vag. Bleeding: None.  Movement: Present. Denies leaking of fluid.   The following portions of the patient's history were reviewed and updated as appropriate: allergies, current medications, past family history, past medical history, past social history, past surgical history and problem list.   Objective:   Vitals:   12/06/20 0957  BP: 135/83  Pulse: 82  Weight: 211 lb (95.7 kg)    Fetal Status: Fetal Heart Rate (bpm): 133   Movement: Present     General:  Alert, oriented and cooperative. Patient is in no acute distress.  Skin: Skin is warm and dry. No rash noted.   Cardiovascular: Normal heart rate noted  Respiratory: Normal respiratory effort, no problems with respiration noted  Abdomen: Soft, gravid, appropriate for gestational age.  Pain/Pressure: Present     Pelvic: Cervical exam deferred        Extremities: Normal range of motion.  Edema: None  Mental Status: Normal mood and affect. Normal behavior. Normal judgment and thought content.   Assessment and Plan:  Pregnancy: G2P0101 at [redacted]w[redacted]d 1. GBS (group B Streptococcus carrier), +RV culture, currently pregnant PCN in labor  2. Supervision of other normal pregnancy, antepartum UTD Has IOL scheduled  3. History of cesarean delivery TOLAC desired  4. Obesity in pregnancy TWG=28 lb (12.7 kg)   Term labor symptoms and general obstetric precautions including but not limited to vaginal bleeding,  contractions, leaking of fluid and fetal movement were reviewed in detail with the patient. Please refer to After Visit Summary for other counseling recommendations.   No follow-ups on file.  Future Appointments  Date Time Provider Department Center  12/08/2020  9:30 AM [facility]-SCREENING [facility] None  12/10/2020 12:00 AM [facility]-LD SCHED ROOM [facility] None    Federico Flake, MD

## 2020-12-08 ENCOUNTER — Other Ambulatory Visit (HOSPITAL_COMMUNITY)
Admission: RE | Admit: 2020-12-08 | Discharge: 2020-12-08 | Disposition: A | Discharge status: Home / Self Care | Payer: Self-pay | Source: Ambulatory Visit | Attending: Obstetrics and Gynecology | Admitting: Obstetrics and Gynecology

## 2020-12-08 NOTE — Progress Notes (Signed)
Patient presented today to the drive-thru COVID testing site for pre-procedural COVID testing. Patient stated she had a positive COVID test in January 2022. She was able to provide documentation of a positive test on 10/11/20. She is unable to be tested at this time due to a positive COVID test within the previous 90 days per policy.

## 2020-12-09 ENCOUNTER — Other Ambulatory Visit: Payer: Self-pay

## 2020-12-09 ENCOUNTER — Inpatient Hospital Stay (HOSPITAL_COMMUNITY)
Admission: AD | Admit: 2020-12-09 | Discharge: 2020-12-12 | DRG: 807 | Disposition: A | Discharge status: Home / Self Care | Payer: Medicaid Other | Attending: Family Medicine | Admitting: Family Medicine

## 2020-12-09 DIAGNOSIS — O34211 Maternal care for low transverse scar from previous cesarean delivery: Secondary | ICD-10-CM | POA: Diagnosis not present

## 2020-12-09 DIAGNOSIS — O34219 Maternal care for unspecified type scar from previous cesarean delivery: Secondary | ICD-10-CM | POA: Diagnosis present

## 2020-12-09 DIAGNOSIS — Z880 Allergy status to penicillin: Secondary | ICD-10-CM

## 2020-12-09 DIAGNOSIS — O480 Post-term pregnancy: Principal | ICD-10-CM | POA: Diagnosis present

## 2020-12-09 DIAGNOSIS — O99824 Streptococcus B carrier state complicating childbirth: Secondary | ICD-10-CM | POA: Diagnosis present

## 2020-12-09 DIAGNOSIS — O99210 Obesity complicating pregnancy, unspecified trimester: Secondary | ICD-10-CM | POA: Diagnosis present

## 2020-12-09 DIAGNOSIS — O99820 Streptococcus B carrier state complicating pregnancy: Secondary | ICD-10-CM

## 2020-12-09 DIAGNOSIS — E669 Obesity, unspecified: Secondary | ICD-10-CM | POA: Diagnosis present

## 2020-12-09 DIAGNOSIS — Z98891 History of uterine scar from previous surgery: Secondary | ICD-10-CM

## 2020-12-09 DIAGNOSIS — Z3A41 41 weeks gestation of pregnancy: Secondary | ICD-10-CM

## 2020-12-09 DIAGNOSIS — O99214 Obesity complicating childbirth: Secondary | ICD-10-CM | POA: Diagnosis present

## 2020-12-10 ENCOUNTER — Other Ambulatory Visit: Payer: Self-pay

## 2020-12-10 ENCOUNTER — Inpatient Hospital Stay (HOSPITAL_COMMUNITY): Payer: Medicaid Other | Admitting: Anesthesiology

## 2020-12-10 ENCOUNTER — Inpatient Hospital Stay (HOSPITAL_COMMUNITY): Payer: Medicaid Other

## 2020-12-10 DIAGNOSIS — O480 Post-term pregnancy: Secondary | ICD-10-CM | POA: Diagnosis present

## 2020-12-10 DIAGNOSIS — O34211 Maternal care for low transverse scar from previous cesarean delivery: Secondary | ICD-10-CM

## 2020-12-10 DIAGNOSIS — Z3A41 41 weeks gestation of pregnancy: Secondary | ICD-10-CM | POA: Diagnosis present

## 2020-12-10 DIAGNOSIS — O99824 Streptococcus B carrier state complicating childbirth: Secondary | ICD-10-CM

## 2020-12-10 LAB — CBC
HCT: 37.7 % (ref 36.0–46.0)
Hemoglobin: 13 g/dL (ref 12.0–15.0)
MCH: 31.3 pg (ref 26.0–34.0)
MCHC: 34.5 g/dL (ref 30.0–36.0)
MCV: 90.6 fL (ref 80.0–100.0)
Platelets: 342 10*3/uL (ref 150–400)
RBC: 4.16 MIL/uL (ref 3.87–5.11)
RDW: 12.8 % (ref 11.5–15.5)
WBC: 9.9 10*3/uL (ref 4.0–10.5)
nRBC: 0 % (ref 0.0–0.2)

## 2020-12-10 LAB — RPR: RPR Ser Ql: NONREACTIVE

## 2020-12-10 LAB — TYPE AND SCREEN
ABO/RH(D): O POS
Antibody Screen: NEGATIVE

## 2020-12-10 MED ORDER — ZOLPIDEM TARTRATE 5 MG PO TABS: 5.0000 mg | ORAL_TABLET | Freq: Every evening | ORAL | Status: DC | PRN

## 2020-12-10 MED ORDER — DIPHENHYDRAMINE HCL 50 MG/ML IJ SOLN: 12.5000 mg | INTRAMUSCULAR | Status: DC | PRN

## 2020-12-10 MED ORDER — LACTATED RINGERS IV SOLN: INTRAVENOUS | Status: DC

## 2020-12-10 MED ORDER — LACTATED RINGERS IV SOLN
500.0000 mL | INTRAVENOUS | Status: DC | PRN
  Administered 2020-12-10: 500 mL via INTRAVENOUS

## 2020-12-10 MED ORDER — TERBUTALINE SULFATE 1 MG/ML IJ SOLN: 0.2500 mg | Freq: Once | INTRAMUSCULAR | Status: DC | PRN

## 2020-12-10 MED ORDER — OXYTOCIN-SODIUM CHLORIDE 30-0.9 UT/500ML-% IV SOLN
2.5000 [IU]/h | INTRAVENOUS | Status: DC
  Administered 2020-12-11: 2.5 [IU]/h via INTRAVENOUS
  Filled 2020-12-10: qty 500

## 2020-12-10 MED ORDER — FENTANYL CITRATE (PF) 100 MCG/2ML IJ SOLN: 50.0000 ug | INTRAMUSCULAR | Status: DC | PRN

## 2020-12-10 MED ORDER — CLINDAMYCIN PHOSPHATE 900 MG/50ML IV SOLN
900.0000 mg | Freq: Three times a day (TID) | INTRAVENOUS | Status: DC
  Administered 2020-12-10 – 2020-12-11 (×4): 900 mg via INTRAVENOUS
  Filled 2020-12-10 (×4): qty 50

## 2020-12-10 MED ORDER — SOD CITRATE-CITRIC ACID 500-334 MG/5ML PO SOLN: 30.0000 mL | ORAL | Status: DC | PRN

## 2020-12-10 MED ORDER — HYDROXYZINE HCL 50 MG PO TABS: 50.0000 mg | ORAL_TABLET | Freq: Four times a day (QID) | ORAL | Status: DC | PRN

## 2020-12-10 MED ORDER — PHENYLEPHRINE 40 MCG/ML (10ML) SYRINGE FOR IV PUSH (FOR BLOOD PRESSURE SUPPORT): 80.0000 ug | PREFILLED_SYRINGE | INTRAVENOUS | Status: DC | PRN

## 2020-12-10 MED ORDER — OXYCODONE-ACETAMINOPHEN 5-325 MG PO TABS: 2.0000 | ORAL_TABLET | ORAL | Status: DC | PRN

## 2020-12-10 MED ORDER — ACETAMINOPHEN 325 MG PO TABS: 650.0000 mg | ORAL_TABLET | ORAL | Status: DC | PRN

## 2020-12-10 MED ORDER — LIDOCAINE HCL (PF) 1 % IJ SOLN: 30.0000 mL | INTRAMUSCULAR | Status: DC | PRN

## 2020-12-10 MED ORDER — LACTATED RINGERS AMNIOINFUSION
INTRAVENOUS | Status: DC
  Administered 2020-12-10: 100 mL via INTRAUTERINE

## 2020-12-10 MED ORDER — LIDOCAINE HCL (PF) 1 % IJ SOLN
INTRAMUSCULAR | Status: DC | PRN
  Administered 2020-12-10: 5 mL via EPIDURAL

## 2020-12-10 MED ORDER — OXYTOCIN BOLUS FROM INFUSION
333.0000 mL | Freq: Once | INTRAVENOUS | Status: AC
  Administered 2020-12-11: 333 mL via INTRAVENOUS

## 2020-12-10 MED ORDER — LACTATED RINGERS IV SOLN
500.0000 mL | Freq: Once | INTRAVENOUS | Status: AC
  Administered 2020-12-10: 500 mL via INTRAVENOUS

## 2020-12-10 MED ORDER — FENTANYL-BUPIVACAINE-NACL 0.5-0.125-0.9 MG/250ML-% EP SOLN
12.0000 mL/h | EPIDURAL | Status: DC | PRN
Start: 2020-12-10 — End: 2020-12-11
  Filled 2020-12-10: qty 250

## 2020-12-10 MED ORDER — EPHEDRINE 5 MG/ML INJ: 10.0000 mg | INTRAVENOUS | Status: DC | PRN

## 2020-12-10 MED ORDER — OXYCODONE-ACETAMINOPHEN 5-325 MG PO TABS: 1.0000 | ORAL_TABLET | ORAL | Status: DC | PRN

## 2020-12-10 MED ORDER — ONDANSETRON HCL 4 MG/2ML IJ SOLN: 4.0000 mg | Freq: Four times a day (QID) | INTRAMUSCULAR | Status: DC | PRN

## 2020-12-10 MED ORDER — OXYTOCIN-SODIUM CHLORIDE 30-0.9 UT/500ML-% IV SOLN
1.0000 m[IU]/min | INTRAVENOUS | Status: DC
  Administered 2020-12-10: 2 m[IU]/min via INTRAVENOUS
  Filled 2020-12-10: qty 500

## 2020-12-10 MED ORDER — FENTANYL-BUPIVACAINE-NACL 0.5-0.125-0.9 MG/250ML-% EP SOLN
EPIDURAL | Status: DC | PRN
  Administered 2020-12-10: 12 mL/h via EPIDURAL

## 2020-12-10 MED ORDER — FLEET ENEMA 7-19 GM/118ML RE ENEM: 1.0000 | ENEMA | Freq: Every day | RECTAL | Status: DC | PRN

## 2020-12-10 NOTE — Progress Notes (Addendum)
  LABOR PROGRESS NOTE  Susan Owens is a 30 y.o. G2P0101 at [redacted]w[redacted]d  admitted for  IOL for post dates.  Subjective: Currently sleeping comfortably.  Lying on left side.  Reports slight increase in pressure in her pelvis but no constant pressure.  Denies any issues or concerns at this time.  Objective: BP (!) 108/52 (BP Location: Left Arm)   Pulse 80   Temp 97.7 F (36.5 C) (Oral)   Resp 18   Ht 5\' 3"  (1.6 m)   Wt 96.2 kg   LMP 02/27/2020   SpO2 98%   BMI 37.57 kg/m  or  Vitals:   12/10/20 1731 12/10/20 1801 12/10/20 1833 12/10/20 1901  BP: 121/65 (!) 116/55 (!) 107/41 (!) 108/52  Pulse: 84 93 77 80  Resp:  18 18 18   Temp:      TempSrc:      SpO2:      Weight:      Height:         Dilation: 6.5 Effacement (%): 90 Cervical Position: Posterior Station: -1 Presentation: Vertex Exam by:: , RN FHT: baseline rate 140s, moderate varibility, +acel, no decel Toco: difficult to trace   Labs: Lab Results  Component Value Date   WBC 9.9 12/10/2020   HGB 13.0 12/10/2020   HCT 37.7 12/10/2020   MCV 90.6 12/10/2020   PLT 342 12/10/2020    Patient Active Problem List   Diagnosis Date Noted  . Post term pregnancy at [redacted] weeks gestation 12/10/2020  . GBS (group B Streptococcus carrier), +RV culture, currently pregnant 11/13/2020  . Low vitamin D level 05/19/2020  . Supervision of other normal pregnancy, antepartum 05/16/2020  . History of cesarean delivery 05/16/2020  . Obesity (BMI 30.0-34.9) 05/16/2020  . Obesity in pregnancy 05/04/2015    Assessment / Plan: 30 y.o. G2P0101 at [redacted]w[redacted]d here for IOL for post dates.   Labor: Patient currently comfortable, SROM and IUPC placed 1530.  Currently on Pitocin 16.  Amnioinfusion also running. will recheck cervix 4 hours after previous check and continue Pitocin as able. Fetal Wellbeing: Category 1, reassuring Pain Control: Epidural in place Anticipated MOD: Vaginal  26, MD  PGY-2, [facility]  12/10/2020, 7:35 PM

## 2020-12-10 NOTE — Progress Notes (Signed)
Susan Owens is a 30 y.o. G2P0101 at [redacted]w[redacted]d by LMP admitted for induction of labor due to Post dates. Due date 12/03/20.  Subjective:  Patient is laying bed, reported to nurse that foley bulb fell out while going to the bathroom. Husband at bedside for support. Patient reports contractions more tolerable since foley bulb out.  Denies difficulty breathing, respiratory distress, chest pain, excessive vaginal bleeding, and leg pain or swelling.  Objective:  BP 130/75   Pulse 80   Temp 98.4 F (36.9 C) (Oral)   Resp 18   Ht 5\' 3"  (1.6 m)   Wt 96.2 kg   LMP 02/27/2020   BMI 37.57 kg/m  No intake/output data recorded. No intake/output data recorded.  FHT:  FHR: 135 bpm, variability: moderate,  accelerations:  Present,  decelerations:  Absent, Cat. 1, reassuring UC:   regular, every 3-5 minutes SVE:   Dilation: 4 Effacement (%): 70 Station: Ballotable Exam by:: [email or web address], Student-midwife  Labs:  Lab Results  Component Value Date   WBC 9.9 12/10/2020   HGB 13.0 12/10/2020   HCT 37.7 12/10/2020   MCV 90.6 12/10/2020   PLT 342 12/10/2020    Assessment:  Induction of labor due to postterm,  progressing well on pitocin    Plan:  Labor: Progressing on Pitocin, will continue to increase then AROM  Discussed waiting to AROM until baby is more engaged for more favorable results.  Preeclampsia:  no signs or symptoms of toxicity   Fetal Wellbeing:  Category I   Pain Control:  Labor support without medications, discussed other pain management options, patient declined at this time but will notify the nurse.  Encouraged patient to get rest if she could,and that if she did want to get up and walk that was ok as well.  I/D:  n/a   Anticipated MOD:  NSVD  12/12/2020 [employer] 12/10/2020, 5:02 AM

## 2020-12-10 NOTE — Progress Notes (Addendum)
  LABOR PROGRESS NOTE  Susan Owens is a 30 y.o. G2P0101 at [redacted]w[redacted]d  admitted for  IOL for post dates.  Subjective: Laying in bed epidural in place.   Objective: BP 113/75   Pulse 91   Temp 97.6 F (36.4 C) (Oral)   Resp 16   Ht 5\' 3"  (1.6 m)   Wt 96.2 kg   LMP 02/27/2020   SpO2 98%   BMI 37.57 kg/m  or  Vitals:   12/10/20 1801 12/10/20 1833 12/10/20 1901 12/10/20 1932  BP: (!) 116/55 (!) 107/41 (!) 108/52 113/75  Pulse: 93 77 80 91  Resp: 18 18 18 16   Temp:    97.6 F (36.4 C)  TempSrc:    Oral  SpO2:      Weight:      Height:         Dilation: 6.5 Effacement (%): 90 Cervical Position: Posterior Station: -1 Presentation: Vertex Exam by:: , RN FHT: baseline rate 140s, moderate varibility, +acel, no decel Toco: Contracting every 2 to 3 minutes  Labs: Lab Results  Component Value Date   WBC 9.9 12/10/2020   HGB 13.0 12/10/2020   HCT 37.7 12/10/2020   MCV 90.6 12/10/2020   PLT 342 12/10/2020    Patient Active Problem List   Diagnosis Date Noted  . Post term pregnancy at [redacted] weeks gestation 12/10/2020  . GBS (group B Streptococcus carrier), +RV culture, currently pregnant 11/13/2020  . Low vitamin D level 05/19/2020  . Supervision of other normal pregnancy, antepartum 05/16/2020  . History of cesarean delivery 05/16/2020  . Obesity (BMI 30.0-34.9) 05/16/2020  . Obesity in pregnancy 05/04/2015    Assessment / Plan: 30 y.o. G2P0101 at [redacted]w[redacted]d here for IOL for post dates.   Labor: Station slightly lower than previous exam.  Rupture of membranes completed and IUPC placed.  We will continue to titrate Pitocin as able.. Fetal Wellbeing: Category 1, reassuring Pain Control: Epidural in place Anticipated MOD: Vaginal   26, MD  PGY-2, [facility]  12/10/2020, 7:44 PM

## 2020-12-10 NOTE — Progress Notes (Signed)
  LABOR PROGRESS NOTE  Susan Owens is a 30 y.o. G2P0101 at [redacted]w[redacted]d  admitted for  IOL for post dates.  Subjective: Pt resting comfortably with epidural in place. No sensation of vaginal pressure with contractions.  Objective: BP 137/69   Pulse 89   Temp 98 F (36.7 C) (Axillary)   Resp 16   Ht 5\' 3"  (1.6 m)   Wt 96.2 kg   LMP 02/27/2020   SpO2 98%   BMI 37.57 kg/m  or  Vitals:   12/10/20 2131 12/10/20 2201 12/10/20 2231 12/10/20 2300  BP: 127/81 125/80 (!) 127/92 137/69  Pulse: (!) 107 93 95 89  Resp: 17 16 17 16   Temp:  98 F (36.7 C)    TempSrc:  Axillary    SpO2:      Weight:      Height:        Dilation: 10 Dilation Complete Date: 12/10/20 Dilation Complete Time: 2306 Effacement (%): 100 Cervical Position: Posterior Station: 0,Plus 1 Presentation: Vertex Exam by:: Marton Malizia, MD FHT: baseline rate 150, moderate varibility, +accels, variable decels with pushing Toco: ctx every 2-3 min  Labs: Lab Results  Component Value Date   WBC 9.9 12/10/2020   HGB 13.0 12/10/2020   HCT 37.7 12/10/2020   MCV 90.6 12/10/2020   PLT 342 12/10/2020    Patient Active Problem List   Diagnosis Date Noted  . Post term pregnancy at [redacted] weeks gestation 12/10/2020  . GBS (group B Streptococcus carrier), +RV culture, currently pregnant 11/13/2020  . Low vitamin D level 05/19/2020  . Supervision of other normal pregnancy, antepartum 05/16/2020  . History of cesarean delivery 05/16/2020  . Obesity (BMI 30.0-34.9) 05/16/2020  . Obesity in pregnancy 05/04/2015    Assessment / Plan: 30 y.o. G2P0101 at [redacted]w[redacted]d here for IOL for post dates.   TOLAC: Patient currently comfortable, SROM and IUPC placed 1530 on 3/13. Amnioinfusion. Pitocin continued at 18 milli-units/min. Given complete cervical dilation attempted several practice pushes with lack of advancement in fetal station. Will labor down with maternal repositioning to facilitate opening of the pelvis and recheck exam in  1-2 hours. Fetal Wellbeing: Category 2 strip given variable decels with pushing; reassuringly, moderate variability with +accels. Pain Control: Epidural in place Anticipated MOD: Vaginal  Mareta Chesnut, [redacted]w[redacted]d, MD OB Fellow, [facility] 12/10/2020 11:40 PM

## 2020-12-10 NOTE — Anesthesia Preprocedure Evaluation (Signed)
Anesthesia Evaluation  Patient identified by MRN, date of birth, ID band Patient awake    Reviewed: Allergy & Precautions, NPO status , Patient's Chart, lab work & pertinent test results  Airway Mallampati: II  TM Distance: >3 FB Neck ROM: Full    Dental no notable dental hx. (+) Teeth Intact, Dental Advisory Given   Pulmonary neg pulmonary ROS,    Pulmonary exam normal breath sounds clear to auscultation       Cardiovascular Exercise Tolerance: Good negative cardio ROS Normal cardiovascular exam Rhythm:Regular Rate:Normal     Neuro/Psych negative neurological ROS     GI/Hepatic Neg liver ROS, GERD  ,  Endo/Other  negative endocrine ROS  Renal/GU negative Renal ROS     Musculoskeletal negative musculoskeletal ROS (+)   Abdominal (+) + obese,   Peds  Hematology Lab Results      Component                Value               Date                      WBC                      9.9                 12/10/2020                HGB                      13.0                12/10/2020                HCT                      37.7                12/10/2020                MCV                      90.6                12/10/2020                PLT                      342                 12/10/2020              Anesthesia Other Findings   Reproductive/Obstetrics (+) Pregnancy                             Anesthesia Physical Anesthesia Plan  ASA: III  Anesthesia Plan: Epidural   Post-op Pain Management:    Induction:   PONV Risk Score and Plan:   Airway Management Planned:   Additional Equipment:   Intra-op Plan:   Post-operative Plan:   Informed Consent: I have reviewed the patients History and Physical, chart, labs and discussed the procedure including the risks, benefits and alternatives for the proposed anesthesia with the patient or authorized representative who has indicated his/her  understanding and acceptance.  Plan Discussed with: CRNA  Anesthesia Plan Comments: (41 wk G2P1 for TOLAC for LEA)        Anesthesia Quick Evaluation

## 2020-12-10 NOTE — Progress Notes (Signed)
Susan Owens is a 30 y.o. G2P0101 at [redacted]w[redacted]d by LMP admitted for induction of labor due to Post dates. Due date 12/03/20.  Subjective: Patient ambulating in the hallway at this time. No acute concerns.   Objective:  BP 122/70 (BP Location: Right Arm)   Pulse 86   Temp 98.1 F (36.7 C) (Axillary)   Resp 18   Ht 5\' 3"  (1.6 m)   Wt 96.2 kg   LMP 02/27/2020   BMI 37.57 kg/m  No intake/output data recorded. No intake/output data recorded.  FHT:  FHR: 135 bpm, variability: moderate,  accelerations:  Present,  decelerations:  Absent, Cat. 1, reassuring UC:   regular, but difficult to trace while ambulating  SVE:   Dilation: 4.5 Effacement (%): 80 Station: -3 Exam by:: [email or web address], RN/Tiffany Yow, RN  Labs:  Lab Results  Component Value Date   WBC 9.9 12/10/2020   HGB 13.0 12/10/2020   HCT 37.7 12/10/2020   MCV 90.6 12/10/2020   PLT 342 12/10/2020    Assessment:  Induction of labor due to postterm,  progressing well on pitocin    Plan:  Labor: Progressing on Pitocin, will continue to increase then AROM  Discussed waiting to AROM until baby is more engaged for more favorable results.  Preeclampsia:  no signs or symptoms of toxicity   Fetal Wellbeing:  Category I   Pain Control:  Labor support without medications, discussed other pain management options, patient declined at this time but will notify the nurse.  Encouraged patient to get rest if she could,and that if she did want to get up and walk that was ok as well.  I/D:  n/a   Anticipated MOD:  NSVD  12/12/2020 [facility] 12/10/2020, 9:36 AM

## 2020-12-10 NOTE — Anesthesia Procedure Notes (Signed)
Epidural Patient location during procedure: OB Start time: 12/10/2020 12:25 PM End time: 12/10/2020 12:39 PM  Staffing Anesthesiologist: Trevor Iha, MD Performed: anesthesiologist   Preanesthetic Checklist Completed: patient identified, IV checked, site marked, risks and benefits discussed, surgical consent, monitors and equipment checked, pre-op evaluation and timeout performed  Epidural Patient position: sitting Prep: DuraPrep and site prepped and draped Patient monitoring: continuous pulse ox and blood pressure Approach: midline Location: L3-L4 Injection technique: LOR air  Needle:  Needle type: Tuohy  Needle gauge: 17 G Needle length: 9 cm and 9 Needle insertion depth: 7 cm Catheter type: closed end flexible Catheter size: 19 Gauge Catheter at skin depth: 12 cm Test dose: negative  Assessment Events: blood not aspirated, injection not painful, no injection resistance, no paresthesia and negative IV test  Additional Notes Patient identified. Risks/Benefits/Options discussed with patient including but not limited to bleeding, infection, nerve damage, paralysis, failed block, incomplete pain control, headache, blood pressure changes, nausea, vomiting, reactions to medication both or allergic, itching and postpartum back pain. Confirmed with bedside nurse the patient's most recent platelet count. Confirmed with patient that they are not currently taking any anticoagulation, have any bleeding history or any family history of bleeding disorders. Patient expressed understanding and wished to proceed. All questions were answered. Sterile technique was used throughout the entire procedure. Please see nursing notes for vital signs. Test dose was given through epidural needle and negative prior to continuing to dose epidural or start infusion. Warning signs of high block given to the patient including shortness of breath, tingling/numbness in hands, complete motor block, or any  concerning symptoms with instructions to call for help. Patient was given instructions on fall risk and not to get out of bed. All questions and concerns addressed with instructions to call with any issues.1  Attempt (S) . Patient tolerated procedure well.

## 2020-12-10 NOTE — Progress Notes (Addendum)
Late entry   LABOR PROGRESS NOTE  Susan Owens is a 30 y.o. G2P0101 at [redacted]w[redacted]d  admitted for  IOL for post dates.  Subjective: Patient is extremely uncomfortable.  Is requesting epidural at this time.  Objective: BP 125/64 (BP Location: Right Arm)   Pulse 76   Temp 98.1 F (36.7 C) (Axillary)   Resp 18   Ht 5\' 3"  (1.6 m)   Wt 96.2 kg   LMP 02/27/2020   SpO2 98%   BMI 37.57 kg/m  or  Vitals:   12/10/20 1332 12/10/20 1401 12/10/20 1431 12/10/20 1501  BP: (!) 100/42 120/70 116/77 125/64  Pulse: 79 84 (!) 102 76  Resp: 18 18 18 18   Temp:      TempSrc:      SpO2:      Weight:      Height:         Dilation: 6 Effacement (%): 80 Cervical Position: Posterior Station: -1 Presentation: Vertex Exam by:: Dr. : baseline rate 140s, moderate varibility, +acel, no decel Toco: difficult to trace   Labs: Lab Results  Component Value Date   WBC 9.9 12/10/2020   HGB 13.0 12/10/2020   HCT 37.7 12/10/2020   MCV 90.6 12/10/2020   PLT 342 12/10/2020    Patient Active Problem List   Diagnosis Date Noted  . Post term pregnancy at [redacted] weeks gestation 12/10/2020  . GBS (group B Streptococcus carrier), +RV culture, currently pregnant 11/13/2020  . Low vitamin D level 05/19/2020  . Supervision of other normal pregnancy, antepartum 05/16/2020  . History of cesarean delivery 05/16/2020  . Obesity (BMI 30.0-34.9) 05/16/2020  . Obesity in pregnancy 05/04/2015    Assessment / Plan: 30 y.o. G2P0101 at [redacted]w[redacted]d here for IOL for post dates.   Labor: Cervical exam unchanged (12 this time.  We will continue Pitocin and recheck 4 hours for possible ruptured membranes. Fetal Wellbeing: Category 1, reassuring Pain Control: Planning for epidural Anticipated MOD: Vaginal  26, MD  PGY-2, [facility]  12/10/2020, 3:19 PM

## 2020-12-10 NOTE — H&P (Addendum)
Obstetric History and Physical  Susan Owens is a 30 y.o. G2P0101 with IUP at [redacted]w[redacted]d presenting for IOL. Patient states she has been having  irregular, every 5-10 minutes contractions, none vaginal bleeding, intact membranes, with active fetal movement.    *She has a history of c/s d/t funic presentation.   Prenatal Course Source of Care: [facility] with onset of care at 11 weeks Pregnancy complications or risks:  Patient Active Problem List   Diagnosis Date Noted   Post term pregnancy at [redacted] weeks gestation 12/10/2020   GBS (group B Streptococcus carrier), +RV culture, currently pregnant 11/13/2020   Low vitamin D level 05/19/2020   Supervision of other normal pregnancy, antepartum 05/16/2020   History of cesarean delivery 05/16/2020   Obesity (BMI 30.0-34.9) 05/16/2020   Obesity in pregnancy 05/04/2015   She plans to breastfeed She desires IUD for postpartum contraception.-To be placed at [facility].  Prenatal labs and studies: ABO, Rh: --/--/O POS (03/13 0040) Antibody: NEG (03/13 0040) Rubella: 8.63 (08/17 1423) RPR: Non Reactive (12/07 0903)  HBsAg: Negative (08/17 1423)  HIV: Non Reactive (12/07 0901)  KGM:WNUUVOZD/-- (02/09 1100) 2 hr Glucola  129/75 Genetic screening  n/a Anatomy US normal  Prenatal Transfer Tool  Maternal Diabetes: No Genetic Screening: Declined Maternal Ultrasounds/Referrals: Normal Fetal Ultrasounds or other Referrals:  None Maternal Substance Abuse:  No Significant Maternal Medications:  None Significant Maternal Lab Results: Group B Strep positive  Past Medical History:  Diagnosis Date   GERD (gastroesophageal reflux disease)    with pregnancy     Past Surgical History:  Procedure Laterality Date   CESAREAN SECTION N/A 09/24/2015   Procedure: CESAREAN SECTION;  Surgeon: Levie Heritage, DO;  Location: [facility];  Service: Obstetrics;  Laterality: N/A;    OB History  Gravida Para Term Preterm AB Living  2 1   1   1   SAB IAB  Ectopic Multiple Live Births        0 1    # Outcome Date GA Lbr Len/2nd Weight Sex Delivery Anes PTL Lv  2 Current           1 Preterm 09/24/15 [redacted]w[redacted]d  2920 g F CS-LVertical Spinal  LIV     Birth Comments: normal exam    Social History   Socioeconomic History   Marital status: Single    Spouse name: Not on file   Number of children: Not on file   Years of education: Not on file   Highest education level: Not on file  Occupational History   Not on file  Tobacco Use   Smoking status: Never Smoker   Smokeless tobacco: Never Used  Substance and Sexual Activity   Alcohol use: No   Drug use: No   Sexual activity: Not Currently    Birth control/protection: None  Other Topics Concern   Not on file  Social History Narrative   Not on file   Social Determinants of Health   Financial Resource Strain: Not on file  Food Insecurity: Not on file  Transportation Needs: Not on file  Physical Activity: Not on file  Stress: Not on file  Social Connections: Not on file    No family history on file.  Medications Prior to Admission  Medication Sig Dispense Refill Last Dose   famotidine (PEPCID) 20 MG tablet Take 1 tablet (20 mg total) by mouth 2 (two) times daily. 60 tablet 3 12/09/2020 at Unknown time   Prenatal Vit-Fe Fumarate-FA (PRENATAL MULTIVITAMIN) TABS tablet  Take 1 tablet by mouth daily at 12 noon.   12/09/2020 at Unknown time    Allergies  Allergen Reactions   Penicillins Diarrhea    Has patient had a PCN reaction causing immediate rash, facial/tongue/throat swelling, SOB or lightheadedness with hypotension: No Has patient had a PCN reaction causing severe rash involving mucus membranes or skin necrosis: No Has patient had a PCN reaction that required hospitalization No Has patient had a PCN reaction occurring within the last 10 years: Yes If all of the above answers are "NO", then may proceed with Cephalosporin use.     Review of Systems: Negative except for what is  mentioned in HPI.  Physical Exam: BP 129/72   Pulse 94   Temp (!) 97.3 F (36.3 C) (Axillary)   Resp 18   Ht 5\' 3"  (1.6 m)   Wt 96.2 kg   LMP 02/27/2020   BMI 37.57 kg/m    CONSTITUTIONAL: Well-developed, well-nourished female in no acute distress.   HENT:  Normocephalic, atraumatic.  NECK: Normal range of motion.  SKIN: Skin is warm and dry. No rash noted. Not diaphoretic. No erythema. No pallor.  NEUROLGIC: Alert and oriented to person, place, and time. Normal reflexes, muscle tone coordination. No cranial nerve deficit noted.  PSYCHIATRIC: Normal mood and affect. Normal behavior. Normal judgment and thought content.  CARDIOVASCULAR: Normal heart rate noted, regular rhythm  RESPIRATORY: Effort and breath sounds normal, no problems with respiration noted  ABDOMEN: Soft, nontender, nondistended, gravid.  MUSCULOSKELETAL: Normal range of motion. Trace edema and no tenderness. 2+ distal pulses.  Cervical Exam: Dilatation 0.5cm   Effacement thick   Station -3    Presentation: cephalic  FHT:  Baseline rate 02/29/2020 bpm   Variability moderate  Accelerations present   Decelerations none Contractions: Every 4-5 mins   Pertinent Labs/Studies:   Results for orders placed or performed during the hospital encounter of 12/09/20 (from the past 24 hour(s))  CBC     Status: None   Collection Time: 12/10/20 12:16 AM  Result Value Ref Range   WBC 9.9 4.0 - 10.5 K/uL   RBC 4.16 3.87 - 5.11 MIL/uL   Hemoglobin 13.0 12.0 - 15.0 g/dL   HCT 12/12/20 73.2 - 20.2 %   MCV 90.6 80.0 - 100.0 fL   MCH 31.3 26.0 - 34.0 pg   MCHC 34.5 30.0 - 36.0 g/dL   RDW 54.2 70.6 - 23.7 %   Platelets 342 150 - 400 K/uL   nRBC 0.0 0.0 - 0.2 %  Type and screen     Status: None   Collection Time: 12/10/20 12:40 AM  Result Value Ref Range   ABO/RH(D) O POS    Antibody Screen NEG    Sample Expiration      12/13/2020,2359 Performed at [facility], [address], [place], [place]      Assessment :  Susan Owens is a 30 y.o. G2P0101 at [redacted]w[redacted]d being admitted for Induction of labor. TOLAC GBS positive  Plan:  Labor: Induction FWB: Reassuring fetal heart tracing.  GBS positive Delivery plan: Hopeful for vaginal delivery  [redacted]w[redacted]d, Student-MidWife [employer] 12/10/20 3:26 AM   Attestation of Supervision of Student:  I confirm that I have verified the information documented in the nurse midwife student's note and that I have also personally performed the history, physical exam and all medical decision making activities.  I have verified that all services and findings are accurately documented in  this student's note; and I agree with management and plan as outlined in the documentation. I have also made any necessary editorial changes.  -NST Reactive -Discussed r/b of induction including fetal distress, serial induction, pain, and increased risk of c/s delivery -Discussed induction methods including cervical ripening agents, foley bulbs, and pitocin -Patient verbalizes understanding and wishes to proceed with induction process -Foley bulb placed with speculum and ring forcep by student. -Nurse instructed to start low dose pitocin and hold at 71mUN/min. -Will start Clindamycin for GBS Prophylaxis as patient with PCN Allergy.  -TOLAC Consent on file dated/signed 09/05/2020. -Reiterated TOLAC risks and benefits. -Informed that cytotec will not be used d/t h/o c/s. -Encouraged rest. -Will monitor and reassess as appropriate.    Cherre Robins, CNM [facility] [employer], [facility] 12/10/2020 3:38 AM

## 2020-12-11 ENCOUNTER — Encounter (HOSPITAL_COMMUNITY): Payer: Self-pay | Admitting: Obstetrics & Gynecology

## 2020-12-11 DIAGNOSIS — O34219 Maternal care for unspecified type scar from previous cesarean delivery: Secondary | ICD-10-CM | POA: Insufficient documentation

## 2020-12-11 DIAGNOSIS — Z3A41 41 weeks gestation of pregnancy: Secondary | ICD-10-CM

## 2020-12-11 DIAGNOSIS — O480 Post-term pregnancy: Secondary | ICD-10-CM

## 2020-12-11 DIAGNOSIS — O99824 Streptococcus B carrier state complicating childbirth: Secondary | ICD-10-CM

## 2020-12-11 MED ORDER — BENZOCAINE-MENTHOL 20-0.5 % EX AERO
1.0000 "application " | INHALATION_SPRAY | CUTANEOUS | Status: DC | PRN
  Filled 2020-12-11: qty 56

## 2020-12-11 MED ORDER — COCONUT OIL OIL
1.0000 "application " | TOPICAL_OIL | Status: DC | PRN
  Administered 2020-12-11: 1 via TOPICAL

## 2020-12-11 MED ORDER — ACETAMINOPHEN 325 MG PO TABS
650.0000 mg | ORAL_TABLET | Freq: Four times a day (QID) | ORAL | Status: DC
  Administered 2020-12-11 – 2020-12-12 (×5): 650 mg via ORAL
  Filled 2020-12-11 (×5): qty 2

## 2020-12-11 MED ORDER — WITCH HAZEL-GLYCERIN EX PADS: 1.0000 "application " | MEDICATED_PAD | CUTANEOUS | Status: DC | PRN

## 2020-12-11 MED ORDER — PRENATAL MULTIVITAMIN CH
1.0000 | ORAL_TABLET | Freq: Every day | ORAL | Status: DC
  Administered 2020-12-11: 1 via ORAL
  Filled 2020-12-11: qty 1

## 2020-12-11 MED ORDER — ONDANSETRON HCL 4 MG/2ML IJ SOLN: 4.0000 mg | INTRAMUSCULAR | Status: DC | PRN

## 2020-12-11 MED ORDER — SIMETHICONE 80 MG PO CHEW
80.0000 mg | CHEWABLE_TABLET | ORAL | Status: DC | PRN
Start: 2020-12-11 — End: 2020-12-12

## 2020-12-11 MED ORDER — ZOLPIDEM TARTRATE 5 MG PO TABS: 5.0000 mg | ORAL_TABLET | Freq: Every evening | ORAL | Status: DC | PRN

## 2020-12-11 MED ORDER — ONDANSETRON HCL 4 MG PO TABS
4.0000 mg | ORAL_TABLET | ORAL | Status: DC | PRN
Start: 2020-12-11 — End: 2020-12-12

## 2020-12-11 MED ORDER — IBUPROFEN 600 MG PO TABS
600.0000 mg | ORAL_TABLET | Freq: Four times a day (QID) | ORAL | Status: DC
  Administered 2020-12-11 – 2020-12-12 (×5): 600 mg via ORAL
  Filled 2020-12-11 (×5): qty 1

## 2020-12-11 MED ORDER — SENNOSIDES-DOCUSATE SODIUM 8.6-50 MG PO TABS: 2.0000 | ORAL_TABLET | Freq: Every day | ORAL | Status: DC

## 2020-12-11 MED ORDER — TETANUS-DIPHTH-ACELL PERTUSSIS 5-2.5-18.5 LF-MCG/0.5 IM SUSY: 0.5000 mL | PREFILLED_SYRINGE | Freq: Once | INTRAMUSCULAR | Status: DC

## 2020-12-11 MED ORDER — DIBUCAINE (PERIANAL) 1 % EX OINT: 1.0000 "application " | TOPICAL_OINTMENT | CUTANEOUS | Status: DC | PRN

## 2020-12-11 MED ORDER — DIPHENHYDRAMINE HCL 25 MG PO CAPS: 25.0000 mg | ORAL_CAPSULE | Freq: Four times a day (QID) | ORAL | Status: DC | PRN

## 2020-12-11 NOTE — Discharge Summary (Signed)
Postpartum Discharge Summary     Patient Name: Susan Owens DOB: April 15, 1991 MRN: [id number]  Date of admission: 12/09/2020 Delivery date:12/11/2020  Delivering provider: Randa Ngo  Date of discharge: 12/12/2020  Admitting diagnosis: Post term pregnancy at [redacted] weeks gestation [O48.0, Z3A.41] Intrauterine pregnancy: 102w1d    Secondary diagnosis:  Principal Problem:   VBAC (vaginal birth after Cesarean) Active Problems:   Obesity in pregnancy   History of cesarean delivery   GBS (group B Streptococcus carrier), +RV culture, currently pregnant   Post term pregnancy at [redacted] weeks gestation  Additional problems: as noted above   Discharge diagnosis: Term VBAC                                              Post partum procedures:none Augmentation: AROM, Pitocin and IP Foley Complications: None  Hospital course: Induction of Labor With Vaginal Delivery   30y.o. yo G2P0101 at 471w1das admitted to the hospital 12/09/2020 for induction of labor.  Indication for induction: Postdates.  Patient had an uncomplicated labor course as follows: Membrane Rupture Time/Date: 3:12 PM ,12/10/2020   Delivery Method:VBAC Episiotomy: None  Lacerations:   right sulcal, right labial, 2nd degree perineal, L hemostatic periurethral Details of delivery can be found in separate delivery note.  Patient had a routine postpartum course. Patient is discharged home 12/12/20.  Newborn Data: Birth date:12/11/2020  Birth time:1:27 AM  Gender:Female  Living status:Living  Apgars:8 ,9  Weight:4000 g   Magnesium Sulfate received: No BMZ received: No Rhophylac:N/A MMR:N/A T-DaP:offered prior to discharge Flu: offered prior to discharge Transfusion:No  Physical exam  Vitals:   12/11/20 1020 12/11/20 1554 12/11/20 2207 12/12/20 0600  BP: 130/68 (!) 118/58 131/76 130/70  Pulse: 84 83 84 80  Resp: _0 Temp: 98 F (36.7 C) 98 F (36.7 C) 98.1 F (36.7 C) 98 F (36.7 C)  TempSrc:  Oral  Oral Oral  SpO2:  100% 99%   Weight:      Height:       General: alert, cooperative and no distress Lochia: appropriate Uterine Fundus: firm Incision: N/A DVT Evaluation: No evidence of DVT seen on physical exam. No cords or calf tenderness. No significant calf/ankle edema. Labs: Lab Results  Component Value Date   WBC 9.9 12/10/2020   HGB 13.0 12/10/2020   HCT 37.7 12/10/2020   MCV 90.6 12/10/2020   PLT 342 12/10/2020   CMP Latest Ref Rng & Units 05/16/2020  Glucose 65 - 99 mg/dL 72  BUN 6 - 20 mg/dL 6  Creatinine 0.57 - 1.00 mg/dL 0.50(L)  Sodium 134 - 144 mmol/L 134  Potassium 3.5 - 5.2 mmol/L 3.7  Chloride 96 - 106 mmol/L 100  CO2 20 - 29 mmol/L 23  Calcium 8.7 - 10.2 mg/dL 9.5  Total Protein 6.0 - 8.5 g/dL 7.1  Total Bilirubin 0.0 - 1.2 mg/dL <0.2  Alkaline Phos 48 - 121 IU/L 47(L)  AST 0 - 40 IU/L 16  ALT 0 - 32 IU/L 13   Edinburgh Score: No flowsheet data found.   After visit meds:  Allergies as of 12/12/2020      Reactions   Penicillins Diarrhea   Has patient had a PCN reaction causing immediate rash, facial/tongue/throat swelling, SOB or lightheadedness with hypotension: No Has patient had a PCN reaction causing severe  rash involving mucus membranes or skin necrosis: No Has patient had a PCN reaction that required hospitalization No Has patient had a PCN reaction occurring within the last 10 years: Yes If all of the above answers are "NO", then may proceed with Cephalosporin use.      Medication List    STOP taking these medications   famotidine 20 MG tablet Commonly known as: PEPCID     TAKE these medications   acetaminophen 325 MG tablet Commonly known as: Tylenol Take 2 tablets (650 mg total) by mouth every 6 (six) hours as needed for mild pain, moderate pain, fever or headache.   coconut oil Oil Apply 1 application topically as needed (nipple pain).   ibuprofen 600 MG tablet Commonly known as: ADVIL Take 1 tablet (600 mg total) by  mouth every 6 (six) hours.   prenatal multivitamin Tabs tablet Take 1 tablet by mouth daily at 12 noon.        Discharge home in stable condition Infant Feeding: breast & bottle Infant Disposition:home with mother Discharge instruction: per After Visit Summary and Postpartum booklet. Activity: Advance as tolerated. Pelvic rest for 6 weeks.  Diet: routine diet Future Appointments: Future Appointments  Date Time Provider Department Center  12/20/2020 10:00 AM [facility] NURSE [facility]  01/22/2021  1:45 PM Anyanwu, Ugonna A, MD [facility]   Follow up Visit: Message sent to [facility] by Dr. Goswick.   Please schedule this patient for a In person postpartum visit in 6 weeks with the following provider: Any provider. Additional Postpartum F/U:BP check 1 week  High risk pregnancy complicated by: h/o Cesarean x1, now with successful VBAC x1 Delivery mode:  Vaginal, Spontaneous  Anticipated Birth Control:  plan for outpatient IUD   Goswick, Anna E, MD OB Fellow, [facility] 12/12/2020 8:05 AM   

## 2020-12-11 NOTE — Lactation Note (Signed)
This note was copied from a baby's chart. Lactation Consultation Note  Patient Name: Susan Owens Date: 12/11/2020 Reason for consult: Initial assessment;Term Age:29 hours  P2 mother whose infant is now 89 hours old.  This is a term baby at 41+1 weeks.  Mother breast fed her first child for one year.  Baby was swaddled and asleep in the bassinet.  Parents were interested in sleeping now, however, mother did say she would enjoy a return visit from lactation later today.  Acknowledged mother's request.  RN updated.   Maternal Data    Feeding Mother's Current Feeding Choice: Breast Milk and Formula  LATCH Score Latch: Grasps breast easily, tongue down, lips flanged, rhythmical sucking.  Audible Swallowing: Spontaneous and intermittent  Type of Nipple: Everted at rest and after stimulation  Comfort (Breast/Nipple): Soft / non-tender  Hold (Positioning): No assistance needed to correctly position infant at breast.  LATCH Score: 10   Lactation Tools Discussed/Used    Interventions    Discharge    Consult Status Consult Status: Follow-up Date: 12/11/20 Follow-up type: In-patient    Ilea Hilton R Marilouise Densmore 12/11/2020, 4:29 AM

## 2020-12-11 NOTE — Discharge Instructions (Signed)

## 2020-12-11 NOTE — Anesthesia Postprocedure Evaluation (Signed)
Anesthesia Post Note  Patient: Susan Owens  Procedure(s) Performed: AN AD HOC LABOR EPIDURAL     Patient location during evaluation: Mother Baby Anesthesia Type: Epidural Level of consciousness: awake and alert Pain management: pain level controlled Vital Signs Assessment: post-procedure vital signs reviewed and stable Respiratory status: spontaneous breathing, nonlabored ventilation and respiratory function stable Cardiovascular status: stable Postop Assessment: no headache, no backache, epidural receding, no apparent nausea or vomiting, patient able to bend at knees, adequate PO intake and able to ambulate Anesthetic complications: no   No complications documented.  Last Vitals:  Vitals:   12/11/20 0356 12/11/20 0531  BP: 128/81 132/83  Pulse: 99 93  Resp: 18 16  Temp: 36.8 C 37.3 C  SpO2: 100% 98%    Last Pain:  Vitals:   12/11/20 0531  TempSrc: Oral  PainSc:    Pain Goal:                   Laban Emperor

## 2020-12-11 NOTE — Lactation Note (Signed)
This note was copied from a baby's chart. Lactation Consultation Note  Patient Name: Susan Owens ZGYFV'C Date: 12/11/2020 Reason for consult: Follow-up assessment;Mother's request;Difficult latch;Term Age:29 hours  Mom called for assistance with latching. Infant had 19 hrs with no urine or stool. LC asked RN, Royden Purl to set Mom up on DEBP. On arrival, Mom had pumped offered infant 3 ml via spoon.   Mom in nursing bra and teashirt Infant in an outfit. LC had Mom remove her clothing to put infant STS and removed outfit off of baby. Baby latched in football with signs of milk transfer with compression. Mom stated nipples sore and she using coconut oil. LC assisted Mom with getting a deeper latch, ensuring infant lips are flanged with cheeks and nose touching her breasts.  Mom to pump after feeding at both breasts using DEBP. Once her volume increases Mom to paced bottle feed EBM using slow flow nipple.  Mom understands infant needs to remain STS especially during feedings at the breasts.  All questions answered at the end of the visit.   Maternal Data Has patient been taught Hand Expression?: Yes Does the patient have breastfeeding experience prior to this delivery?: Yes How long did the patient breastfeed?: 1 year  Feeding Mother's Current Feeding Choice: Breast Milk  LATCH Score Latch: Repeated attempts needed to sustain latch, nipple held in mouth throughout feeding, stimulation needed to elicit sucking reflex.  Audible Swallowing: Spontaneous and intermittent  Type of Nipple: Everted at rest and after stimulation  Comfort (Breast/Nipple): Soft / non-tender  Hold (Positioning): Assistance needed to correctly position infant at breast and maintain latch.  LATCH Score: 8   Lactation Tools Discussed/Used Tools: Pump;Flanges Breast pump type: Double-Electric Breast Pump Pump Education: Setup, frequency, and cleaning;Milk Storage Reason for Pumping: RN set up  pump/ increase stimulation Pumping frequency: every 3 hrs for 15 minutes  Interventions Interventions: Breast feeding basics reviewed;Breast compression;Assisted with latch;Adjust position;Skin to skin;Support pillows;Breast massage;Position options;Hand express;Expressed milk;Pre-pump if needed;DEBP;Education  Discharge    Consult Status Consult Status: Follow-up Date: 12/12/20 Follow-up type: In-patient    Jonmarc Bodkin  Nicholson-Springer 12/11/2020, 9:23 PM

## 2020-12-12 MED ORDER — ACETAMINOPHEN 325 MG PO TABS: 650.0000 mg | ORAL_TABLET | Freq: Four times a day (QID) | ORAL | Status: AC | PRN

## 2020-12-12 MED ORDER — COCONUT OIL OIL
1.0000 | TOPICAL_OIL | 0 refills | Status: AC | PRN
Start: 2020-12-12 — End: ?

## 2020-12-12 MED ORDER — IBUPROFEN 600 MG PO TABS: 600.0000 mg | ORAL_TABLET | Freq: Four times a day (QID) | ORAL | 0 refills | Status: AC

## 2020-12-12 NOTE — Lactation Note (Signed)
This note was copied from a baby's chart. Lactation Consultation Note  Patient Name: Susan Owens ZRAQT'M Date: 12/12/2020 Reason for consult: Follow-up assessment;Infant weight loss;Term Age:29 hours  Its been 5 year since the baby her 1st baby .  LC and RN reviewed breast feeding basics and assisted with 2 different  Latches and the mom and baby did well . Increased swallows with breast compressions. Latch Score.  See LC tools and education below.  Mom has the [facility] brochure with resource numbers.    Maternal Data Has patient been taught Hand Expression?: Yes Does the patient have breastfeeding experience prior to this delivery?: Yes How long did the patient breastfeed?: 1 year  Feeding Mother's Current Feeding Choice: Breast Milk  LATCH Score Latch: Grasps breast easily, tongue down, lips flanged, rhythmical sucking.  Audible Swallowing: Spontaneous and intermittent  Type of Nipple: Everted at rest and after stimulation  Comfort (Breast/Nipple): Filling, red/small blisters or bruises, mild/mod discomfort  Hold (Positioning): Assistance needed to correctly position infant at breast and maintain latch.  LATCH Score: 8   Lactation Tools Discussed/Used Tools: Shells;Pump;Flanges;Coconut oil;Comfort gels Flange Size: 24;27 Breast pump type: Double-Electric Breast Pump;Manual Pump Education: Setup, frequency, and cleaning;Milk Storage Reason for Pumping: prn  Interventions Interventions: Breast feeding basics reviewed  Discharge Discharge Education: Warning signs for feeding baby;Engorgement and breast care Pump: DEBP;Manual WIC Program: Yes  Consult Status Consult Status: Complete Date: 12/12/20    Susan Owens 12/12/2020, 9:31 AM

## 2021-01-22 ENCOUNTER — Ambulatory Visit: Payer: Self-pay | Admitting: Obstetrics & Gynecology
